# Patient Record
Sex: Male | Born: 1965 | Race: White | Hispanic: No | Marital: Married | Smoking: Former smoker
Health system: Southern US, Community
[De-identification: ages and names within clinical notes are randomized; demographics above are authoritative.]

## PROBLEM LIST (undated history)

## (undated) DIAGNOSIS — I82409 Acute embolism and thrombosis of unspecified deep veins of unspecified lower extremity: Secondary | ICD-10-CM

## (undated) DIAGNOSIS — M1990 Unspecified osteoarthritis, unspecified site: Secondary | ICD-10-CM

## (undated) HISTORY — PX: CYSTECTOMY: SUR359

## (undated) HISTORY — PX: SHOULDER ARTHROSCOPY W/ ACROMIAL REPAIR: SUR94

---

## 2009-03-26 ENCOUNTER — Ambulatory Visit (HOSPITAL_COMMUNITY): Admission: RE | Admit: 2009-03-26 | Discharge: 2009-03-26 | Payer: Self-pay | Admitting: General Surgery

## 2009-12-10 ENCOUNTER — Emergency Department (HOSPITAL_COMMUNITY)
Admission: RE | Admit: 2009-12-10 | Discharge: 2009-12-10 | Payer: Self-pay | Source: Home / Self Care | Admitting: Family Medicine

## 2010-02-14 ENCOUNTER — Ambulatory Visit: Payer: Self-pay | Admitting: Internal Medicine

## 2010-03-01 ENCOUNTER — Ambulatory Visit (HOSPITAL_COMMUNITY)
Admission: RE | Admit: 2010-03-01 | Discharge: 2010-03-01 | Payer: Self-pay | Source: Home / Self Care | Attending: Internal Medicine | Admitting: Internal Medicine

## 2010-03-01 ENCOUNTER — Ambulatory Visit: Payer: Self-pay | Admitting: Internal Medicine

## 2010-04-10 ENCOUNTER — Encounter: Payer: Self-pay | Admitting: Internal Medicine

## 2010-04-18 NOTE — Consult Note (Signed)
NAME:  James Hatfield, IDROVO NO.:  [id number]  MEDICAL RECORD NO.:  [id number]           PATIENT TYPE:  LOCATION:                                FACILITY:  [facility]  PHYSICIAN:  Lionel December, M.D.    DATE OF BIRTH:  [birth year]  DATE OF CONSULTATION:  02/14/2010 DATE OF DISCHARGE:                                CONSULTATION   REASON FOR CONSULTATION:  Recent history of diverticulitis, family history of colon carcinoma.  HISTORY OF PRESENT ILLNESS:  James Hatfield is a 45 year old Caucasian male who is referred through courtesy of Dr. Catalina Pizza for GI evaluation.  About 2 months ago, he developed left lower quadrant abdominal pain and was diagnosed with sigmoid diverticulitis on a visit to emergency room and he had an abdominopelvic CT.  He was treated with antibiotics for 10-14 days with the resolution of his symptoms.  He followed up with Dr. Margo Aye, felt that he should undergo colonoscopy to make sure he does not have lesions other than diverticulitis.  He is presently free of abdominal pain.  He says his bowels move 2-3 times a day and most of his stools are soft formed.  This has been in its regular pattern.  He denies melena or rectal bleeding.  States he has a very good appetite.  He experiences heartburn only with certain foods.  He has not lost any weight recently.  CURRENT MEDICATIONS: 1. Flonase 1 squirt to each nostril daily p.r.n. 2. MVI daily p.r.n.  PAST MEDICAL HISTORY:  He had lipoma removed from his back by Dr. Leticia Penna 1 year ago.  History of diverticulitis as above.  Allergic rhinitis.  ALLERGIES:  To PENICILLIN, details unknown.  FAMILY HISTORY:  Mother is 73 and has heart problems.  Father was diagnosed with colon carcinoma at age 28 and died at 63.  He has 6 sisters, but their health status is unknown.  One brother has been treated for diverticulitis and doing fine.  Other brother's health status is not known.  SOCIAL HISTORY:  He is married  (second marriage).  He does not have any children.  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  He smokes cigarettes off and on no more than 20 cigarettes per month.  He drinks alcohol intermittently and may drink 6- 12 packs over weekend.  OBJECTIVE:  VITAL SIGNS:  Weight 229.7 pounds.  He is 74 inches tall. Pulse 78 per minute, blood pressure 118/80, temperature of 97.4.  HEENT: Conjunctivae are pink.  Sclerae are nonicteric.  Oropharyngeal mucosa is normal. NECK:  No neck masses or thyromegaly noted. CARDIAC:  With regular rhythm.  Normal S1 and S2.  No murmur or gallop noted. LUNGS:  Clear to auscultation. ABDOMEN:  Full.  Bowel sounds are normal on palpation.  Soft abdomen without tenderness, organomegaly or masses. RECTAL:  Deferred.  No peripheral edema or clubbing noted.  ASSESSMENT:  [name] is a 45 year old Caucasian male who was recently treated for sigmoid diverticulitis and he is presently pain free.  I agree his colon needs to be examined to make  sure he did not have any other condition mimicking diverticulitis i.e. tumor.  Family history is significant for colon carcinoma when his father was 52 years old when this diagnosis was made and that does not necessarily put Nthony in a high risk category.  RECOMMENDATIONS:  High-fiber diet.  Diagnostic colonoscopy to be performed in near future.  Also asked Kaylee not to take NSAIDs on regular basis as NSAID therapy increases the risk of diverticulitis.  We appreciate the opportunity to participate in the care of this gentleman.     Lionel December, M.D.     NR/MEDQ  D:  02/14/2010  T:  02/15/2010  Job:  [id number]  cc:   Catalina Pizza, M.D. Fax: [phone]  Electronically Signed by Lionel December M.D. on 04/18/2010 07:11:43 AM

## 2010-06-02 LAB — COMPREHENSIVE METABOLIC PANEL
AST: 20 U/L (ref 0–37)
Albumin: 4.1 g/dL (ref 3.5–5.2)
Calcium: 8.3 mg/dL — ABNORMAL LOW (ref 8.4–10.5)
Creatinine, Ser: 1.36 mg/dL (ref 0.4–1.5)
GFR calc Af Amer: >60 mL/min (ref >60)
GFR calc non Af Amer: 57 mL/min — ABNORMAL LOW (ref >60)

## 2010-06-02 LAB — CBC
MCH: 32 pg (ref 26.0–34.0)
MCHC: 34.8 g/dL (ref 30.0–36.0)
Platelets: 192 10*3/uL (ref 150–400)
RDW: 13.1 % (ref 11.5–15.5)

## 2010-06-02 LAB — DIFFERENTIAL
Eosinophils Relative: 1 % (ref 0–5)
Lymphocytes Relative: 11 % — ABNORMAL LOW (ref 12–46)
Lymphs Abs: 1.2 10*3/uL (ref 0.7–4.0)
Monocytes Absolute: 1 10*3/uL (ref 0.1–1.0)

## 2010-06-02 LAB — LIPASE, BLOOD: Lipase: 25 U/L (ref 11–59)

## 2010-06-05 LAB — BASIC METABOLIC PANEL
BUN: 11 mg/dL (ref 6–23)
Chloride: 101 mEq/L (ref 96–112)
Potassium: 4.2 mEq/L (ref 3.5–5.1)

## 2010-06-05 LAB — CBC
HCT: 44.2 % (ref 39.0–52.0)
Hemoglobin: 14.9 g/dL (ref 13.0–17.0)
MCV: 92.4 fL (ref 78.0–100.0)
Platelets: 209 10*3/uL (ref 150–400)
WBC: 5.3 10*3/uL (ref 4.0–10.5)

## 2014-07-28 ENCOUNTER — Other Ambulatory Visit (HOSPITAL_COMMUNITY): Payer: Self-pay | Admitting: Internal Medicine

## 2014-07-28 ENCOUNTER — Ambulatory Visit (HOSPITAL_COMMUNITY)
Admission: RE | Admit: 2014-07-28 | Discharge: 2014-07-28 | Disposition: A | Discharge status: Home / Self Care | Payer: 59 | Source: Ambulatory Visit | Attending: Internal Medicine | Admitting: Internal Medicine

## 2014-07-28 DIAGNOSIS — R2242 Localized swelling, mass and lump, left lower limb: Secondary | ICD-10-CM | POA: Diagnosis not present

## 2015-03-19 ENCOUNTER — Emergency Department (HOSPITAL_COMMUNITY)
Admission: EM | Admit: 2015-03-19 | Discharge: 2015-03-19 | Disposition: A | Discharge status: Home / Self Care | Payer: 59 | Attending: Emergency Medicine | Admitting: Emergency Medicine

## 2015-03-19 ENCOUNTER — Encounter (HOSPITAL_COMMUNITY): Payer: Self-pay | Admitting: *Deleted

## 2015-03-19 ENCOUNTER — Emergency Department (HOSPITAL_COMMUNITY): Payer: 59

## 2015-03-19 DIAGNOSIS — Y9289 Other specified places as the place of occurrence of the external cause: Secondary | ICD-10-CM | POA: Insufficient documentation

## 2015-03-19 DIAGNOSIS — S92502A Displaced unspecified fracture of left lesser toe(s), initial encounter for closed fracture: Secondary | ICD-10-CM

## 2015-03-19 DIAGNOSIS — Z880 Allergy status to penicillin: Secondary | ICD-10-CM | POA: Diagnosis not present

## 2015-03-19 DIAGNOSIS — Y9389 Activity, other specified: Secondary | ICD-10-CM | POA: Insufficient documentation

## 2015-03-19 DIAGNOSIS — W228XXA Striking against or struck by other objects, initial encounter: Secondary | ICD-10-CM | POA: Insufficient documentation

## 2015-03-19 DIAGNOSIS — Y998 Other external cause status: Secondary | ICD-10-CM | POA: Diagnosis not present

## 2015-03-19 DIAGNOSIS — S90935A Unspecified superficial injury of left lesser toe(s), initial encounter: Secondary | ICD-10-CM | POA: Diagnosis present

## 2015-03-19 DIAGNOSIS — S92512A Displaced fracture of proximal phalanx of left lesser toe(s), initial encounter for closed fracture: Secondary | ICD-10-CM | POA: Diagnosis not present

## 2015-03-19 MED ORDER — DIAZEPAM 5 MG PO TABS
5.0000 mg | ORAL_TABLET | Freq: Once | ORAL | Status: AC
  Administered 2015-03-19: 5 mg via ORAL
  Filled 2015-03-19: qty 1

## 2015-03-19 MED ORDER — HYDROCODONE-ACETAMINOPHEN 5-325 MG PO TABS: 1.0000 | ORAL_TABLET | ORAL | Status: DC | PRN

## 2015-03-19 MED ORDER — HYDROCODONE-ACETAMINOPHEN 5-325 MG PO TABS
2.0000 | ORAL_TABLET | Freq: Once | ORAL | Status: AC
  Administered 2015-03-19: 2 via ORAL
  Filled 2015-03-19: qty 2

## 2015-03-19 MED ORDER — LIDOCAINE HCL (PF) 2 % IJ SOLN
10.0000 mL | Freq: Once | INTRAMUSCULAR | Status: AC
  Administered 2015-03-19: 10 mL
  Filled 2015-03-19: qty 10

## 2015-03-19 NOTE — ED Notes (Signed)
Pt reporting smashing little toe of left foot on coffee table.  Toe swollen at this time.

## 2015-03-19 NOTE — Discharge Instructions (Signed)
Please apply ice to your left fifth toe, and keep your foot elevated above your waist is much as possible. Use Tylenol or ibuprofen for mild discomfort. Use Norco for more severe pain. Toe Fracture A toe fracture is a break in one of the toe bones (phalanges). HOME CARE If You Have a Cast:  Do not stick anything inside the cast to scratch your skin.  Check the skin around the cast every day. Tell your doctor about any concerns. Do not put lotion on the skin underneath the cast. You may put lotion on dry skin around the edges of the cast.  Do not put pressure on any part of the cast until it is fully hardened. This may take many hours.  Keep the cast clean and dry. Bathing  Do not take baths, swim, or use a hot tub until your doctor says that you can. Ask your doctor if you can take showers. You may only be allowed to take sponge baths for bathing.  If your doctor says that bathing and showering are okay, cover the cast or bandage (dressing) with a watertight plastic bag to protect it from water. Do not let the cast or bandage get wet. Managing Pain, Stiffness, and Swelling  If you do not have a cast, put ice on the injured area if told by your doctor:  Put ice in a plastic bag.  Place a towel between your skin and the bag.  Leave the ice on for 20 minutes, 2-3 times per day.  Move your toes often to avoid stiffness and to lessen swelling.  Raise (elevate) the injured area above the level of your heart while you are sitting or lying down. Driving  Do not drive or use heavy machinery while taking pain medicine.  Do not drive while wearing a cast on a foot that you use for driving. Activity  Return to your normal activities as told by your doctor. Ask your doctor what activities are safe for you.  Perform exercises daily as told by your doctor or therapist. Safety  Do not use your leg to support your body weight until your doctor says that you can. Use crutches or other tools  to help you move around as told by your doctor. General Instructions  If your toe was taped to a toe that is next to it (buddy taping), follow your doctor's instructions for changing the gauze and tape. Change it more often:  If the gauze and tape get wet. If this happens, dry the space between the toes.  If the gauze and tape are too tight and they cause your toe to become pale or to lose feeling (numb).  Wear a protective shoe as told by your doctor. If you were not given one, wear sturdy shoes that support your foot. Your shoes should not pinch your toes. Your shoes should not fit tightly against your toes.  Do not use any tobacco products, including cigarettes, chewing tobacco, or e-cigarettes. Tobacco can delay bone healing. If you need help quitting, ask your doctor.  Take medicines only as told by your doctor.  Keep all follow-up visits as told by your doctor. This is important. GET HELP IF:  You have a fever.  Your pain medicine is not helping.  Your toe feels cold.  You lose feeling (have numbness) in your toe.  You still have pain after one week of rest and treatment.  You still have pain after your doctor has said that you can start  walking again.  You have pain or tingling in your foot, and it is not going away.  You have loss of feeling in your foot, and it is not going away. GET HELP RIGHT AWAY IF:  You have severe pain.  You have redness or swelling (inflammation) in your toe, and it is getting worse.  You have pain or loss of feeling in your toe, and it is getting worse.  Your toe is blue.   This information is not intended to replace advice given to you by your health care provider. Make sure you discuss any questions you have with your health care provider.   Document Released: 08/23/2007 Document Revised: 07/21/2014 Document Reviewed: 12/31/2013 Elsevier Interactive Patient Education [employer].

## 2015-03-19 NOTE — ED Provider Notes (Signed)
CSN: [id number]     Arrival date & time 03/19/15  2131 History   None    No chief complaint on file.    (Consider location/radiation/quality/duration/timing/severity/associated sxs/prior Treatment) Patient is a 49 y.o. male presenting with toe pain. The history is provided by the patient.  Toe Pain This is a new problem. The current episode started today. The problem occurs constantly. The problem has been unchanged. Associated symptoms include arthralgias. Pertinent negatives include no abdominal pain, chest pain, coughing, neck pain or numbness. The symptoms are aggravated by standing. He has tried nothing for the symptoms. The treatment provided no relief.    History reviewed. No pertinent past medical history. History reviewed. No pertinent past surgical history. History reviewed. No pertinent family history. Social History  Substance Use Topics  . Smoking status: Never Smoker   . Smokeless tobacco: None  . Alcohol Use: Yes     Comment: occasionally    Review of Systems  Constitutional: Negative for activity change.       All ROS Neg except as noted in HPI  HENT: Negative for nosebleeds.   Eyes: Negative for photophobia and discharge.  Respiratory: Negative for cough, shortness of breath and wheezing.   Cardiovascular: Negative for chest pain and palpitations.  Gastrointestinal: Negative for abdominal pain and blood in stool.  Genitourinary: Negative for dysuria, frequency and hematuria.  Musculoskeletal: Positive for arthralgias. Negative for back pain and neck pain.  Skin: Negative.   Neurological: Negative for dizziness, seizures, speech difficulty and numbness.  Psychiatric/Behavioral: Negative for hallucinations and confusion.      Allergies  Penicillins  Home Medications   Prior to Admission medications   Not on File   BP 152/97 mmHg  Pulse 82  Temp(Src) 97.8 F (36.6 C) (Oral)  Resp 18  Ht 6\' 2"  (1.88 m)  Wt 108.863 kg  BMI 30.80 kg/m2  SpO2  96% Physical Exam  Constitutional: He is oriented to person, place, and time. He appears well-developed and well-nourished.  Non-toxic appearance.  HENT:  Head: Normocephalic.  Right Ear: Tympanic membrane and external ear normal.  Left Ear: Tympanic membrane and external ear normal.  Eyes: EOM and lids are normal. Pupils are equal, round, and reactive to light.  Neck: Normal range of motion. Neck supple. Carotid bruit is not present.  Cardiovascular: Normal rate, regular rhythm, normal heart sounds, intact distal pulses and normal pulses.   Pulmonary/Chest: Breath sounds normal. No respiratory distress.  Abdominal: Soft. Bowel sounds are normal. There is no tenderness. There is no guarding.  Musculoskeletal: Normal range of motion.       Feet:  Lymphadenopathy:       Head (right side): No submandibular adenopathy present.       Head (left side): No submandibular adenopathy present.    He has no cervical adenopathy.  Neurological: He is alert and oriented to person, place, and time. He has normal strength. No cranial nerve deficit or sensory deficit.  Skin: Skin is warm and dry.  Psychiatric: He has a normal mood and affect. His speech is normal.  Nursing note and vitals reviewed.   ED Course  ORTHOPEDIC INJURY TREATMENT Date/Time: 03/19/2015 11:21 PM Performed by: Lily Kocher Authorized by: Lily Kocher Consent: Verbal consent obtained. Risks and benefits: risks, benefits and alternatives were discussed Consent given by: patient Patient understanding: patient states understanding of the procedure being performed Patient identity confirmed: arm band Time out: Immediately prior to procedure a "time out" was called to verify the correct  patient, procedure, equipment, support staff and site/side marked as required. Injury location: toe Location details: left fifth toe Injury type: fracture-dislocation Pre-procedure neurovascular assessment: neurovascularly  intact Pre-procedure distal perfusion: normal Pre-procedure neurological function: normal Pre-procedure range of motion: reduced Local anesthesia used: yes Anesthesia: digital block Local anesthetic: lidocaine 2% without epinephrine Patient sedated: no Manipulation performed: yes Reduction successful: yes Immobilization: splint (buddy tape) Supplies used: cotton padding (post op shoe) Post-procedure neurovascular assessment: post-procedure neurovascularly intact Post-procedure distal perfusion: normal Post-procedure neurological function: normal Patient tolerance: Patient tolerated the procedure well with no immediate complications   (including critical care time) Labs Review Labs Reviewed - No data to display  Imaging Review No results found. I have personally reviewed and evaluated these images and lab results as part of my medical decision-making.   EKG Interpretation None      MDM Patient sustained a fracture/dislocation of the left fifth toe earlier this evening. The x-ray reveals an acute oblique fracture with some displacement and angulation present.  The patient was reduced, buddy tape splint was applied, and postoperative shoe applied. Patient will follow-up with podiatry. Ice and elevation discussed with the patient. Prescription for Norco every 4 hours as needed given to the patient.    Final diagnoses:  None    *I have reviewed nursing notes, vital signs, and all appropriate lab and imaging results for this patient.[address] PA-C 03/19/15 [place], MD 03/20/15 [phone]

## 2016-04-25 ENCOUNTER — Encounter: Payer: Self-pay | Admitting: Internal Medicine

## 2016-04-25 ENCOUNTER — Encounter: Payer: Self-pay | Admitting: *Deleted

## 2018-01-21 ENCOUNTER — Other Ambulatory Visit: Payer: Self-pay | Admitting: Sports Medicine

## 2018-01-21 ENCOUNTER — Other Ambulatory Visit (HOSPITAL_COMMUNITY): Payer: Self-pay | Admitting: Sports Medicine

## 2018-01-21 DIAGNOSIS — M25561 Pain in right knee: Principal | ICD-10-CM

## 2018-01-21 DIAGNOSIS — M25461 Effusion, right knee: Secondary | ICD-10-CM

## 2018-01-21 DIAGNOSIS — G8929 Other chronic pain: Secondary | ICD-10-CM

## 2018-01-25 ENCOUNTER — Ambulatory Visit (HOSPITAL_COMMUNITY)
Admission: RE | Admit: 2018-01-25 | Discharge: 2018-01-25 | Disposition: A | Discharge status: Home / Self Care | Payer: 59 | Source: Ambulatory Visit | Attending: Sports Medicine | Admitting: Sports Medicine

## 2018-01-25 DIAGNOSIS — G8929 Other chronic pain: Secondary | ICD-10-CM | POA: Diagnosis present

## 2018-01-25 DIAGNOSIS — M238X1 Other internal derangements of right knee: Secondary | ICD-10-CM | POA: Insufficient documentation

## 2018-01-25 DIAGNOSIS — M25561 Pain in right knee: Secondary | ICD-10-CM | POA: Diagnosis present

## 2018-01-25 DIAGNOSIS — M7121 Synovial cyst of popliteal space [Baker], right knee: Secondary | ICD-10-CM | POA: Diagnosis not present

## 2018-01-25 DIAGNOSIS — M25461 Effusion, right knee: Secondary | ICD-10-CM | POA: Insufficient documentation

## 2018-02-27 ENCOUNTER — Encounter: Payer: Self-pay | Admitting: *Deleted

## 2018-02-27 ENCOUNTER — Other Ambulatory Visit: Payer: Self-pay

## 2018-02-27 NOTE — Anesthesia Preprocedure Evaluation (Addendum)
Anesthesia Evaluation  Patient identified by MRN, date of birth, ID band Patient awake    Reviewed: Allergy & Precautions, H&P , NPO status , Patient's Chart, lab work & pertinent test results, reviewed documented beta blocker date and time   Airway Mallampati: II  TM Distance: >3 FB Neck ROM: full    Dental no notable dental hx.    Pulmonary former smoker (quit 2005),    Pulmonary exam normal breath sounds clear to auscultation       Cardiovascular Exercise Tolerance: Good + DVT (left leg)  Normal cardiovascular exam Rhythm:regular Rate:Normal     Neuro/Psych negative neurological ROS  negative psych ROS   GI/Hepatic negative GI ROS, Neg liver ROS,   Endo/Other  negative endocrine ROS  Renal/GU negative Renal ROS  negative genitourinary   Musculoskeletal  (+) Arthritis ,   Abdominal   Peds  Hematology negative hematology ROS (+)   Anesthesia Other Findings   Reproductive/Obstetrics negative OB ROS                            Anesthesia Physical Anesthesia Plan  ASA: II  Anesthesia Plan: General   Post-op Pain Management:    Induction: Intravenous  PONV Risk Score and Plan: 2 and Dexamethasone and Ondansetron  Airway Management Planned: LMA  Additional Equipment:   Intra-op Plan:   Post-operative Plan: Extubation in OR  Informed Consent: I have reviewed the patients History and Physical, chart, labs and discussed the procedure including the risks, benefits and alternatives for the proposed anesthesia with the patient or authorized representative who has indicated his/her understanding and acceptance.   Dental Advisory Given  Plan Discussed with: CRNA and Anesthesiologist  Anesthesia Plan Comments:        Anesthesia Quick Evaluation

## 2018-03-06 ENCOUNTER — Ambulatory Visit
Admission: RE | Admit: 2018-03-06 | Discharge: 2018-03-06 | Disposition: A | Discharge status: Home / Self Care | Payer: 59 | Attending: Surgery | Admitting: Surgery

## 2018-03-06 ENCOUNTER — Ambulatory Visit: Payer: 59 | Admitting: Anesthesiology

## 2018-03-06 ENCOUNTER — Encounter
Admission: RE | Disposition: A | Discharge status: Home / Self Care | Payer: Self-pay | Source: Home / Self Care | Attending: Surgery

## 2018-03-06 DIAGNOSIS — Z7982 Long term (current) use of aspirin: Secondary | ICD-10-CM | POA: Insufficient documentation

## 2018-03-06 DIAGNOSIS — M1711 Unilateral primary osteoarthritis, right knee: Secondary | ICD-10-CM | POA: Diagnosis not present

## 2018-03-06 DIAGNOSIS — S83281A Other tear of lateral meniscus, current injury, right knee, initial encounter: Secondary | ICD-10-CM | POA: Diagnosis present

## 2018-03-06 DIAGNOSIS — Z8489 Family history of other specified conditions: Secondary | ICD-10-CM | POA: Diagnosis not present

## 2018-03-06 DIAGNOSIS — S83231A Complex tear of medial meniscus, current injury, right knee, initial encounter: Secondary | ICD-10-CM | POA: Insufficient documentation

## 2018-03-06 DIAGNOSIS — Z880 Allergy status to penicillin: Secondary | ICD-10-CM | POA: Diagnosis not present

## 2018-03-06 DIAGNOSIS — Z86718 Personal history of other venous thrombosis and embolism: Secondary | ICD-10-CM | POA: Insufficient documentation

## 2018-03-06 DIAGNOSIS — Z800 Family history of malignant neoplasm of digestive organs: Secondary | ICD-10-CM | POA: Insufficient documentation

## 2018-03-06 DIAGNOSIS — Z87891 Personal history of nicotine dependence: Secondary | ICD-10-CM | POA: Diagnosis not present

## 2018-03-06 DIAGNOSIS — X58XXXA Exposure to other specified factors, initial encounter: Secondary | ICD-10-CM | POA: Diagnosis not present

## 2018-03-06 DIAGNOSIS — Z79899 Other long term (current) drug therapy: Secondary | ICD-10-CM | POA: Diagnosis not present

## 2018-03-06 HISTORY — DX: Acute embolism and thrombosis of unspecified deep veins of unspecified lower extremity: I82.409

## 2018-03-06 HISTORY — DX: Unspecified osteoarthritis, unspecified site: M19.90

## 2018-03-06 HISTORY — PX: KNEE ARTHROSCOPY WITH MEDIAL MENISECTOMY: SHX5651

## 2018-03-06 SURGERY — ARTHROSCOPY, KNEE, WITH MEDIAL MENISCECTOMY
Anesthesia: General | Site: Knee | Laterality: Right

## 2018-03-06 MED ORDER — LIDOCAINE-EPINEPHRINE 1 %-1:100000 IJ SOLN
INTRAMUSCULAR | Status: DC | PRN
  Administered 2018-03-06: 20 mL

## 2018-03-06 MED ORDER — HYDROMORPHONE HCL 1 MG/ML IJ SOLN: 0.2500 mg | INTRAMUSCULAR | Status: DC | PRN

## 2018-03-06 MED ORDER — PROPOFOL 10 MG/ML IV BOLUS
INTRAVENOUS | Status: DC | PRN
  Administered 2018-03-06: 200 mg via INTRAVENOUS

## 2018-03-06 MED ORDER — LIDOCAINE HCL (CARDIAC) PF 100 MG/5ML IV SOSY
PREFILLED_SYRINGE | INTRAVENOUS | Status: DC | PRN
  Administered 2018-03-06: 30 mg via INTRATRACHEAL

## 2018-03-06 MED ORDER — CLINDAMYCIN PHOSPHATE 600 MG/4ML IJ SOLN
900.0000 mg | Freq: Once | INTRAMUSCULAR | Status: AC
  Administered 2018-03-06: 900 mg via INTRAVENOUS

## 2018-03-06 MED ORDER — ACETAMINOPHEN 160 MG/5ML PO SOLN: 325.0000 mg | ORAL | Status: DC | PRN

## 2018-03-06 MED ORDER — FENTANYL CITRATE (PF) 100 MCG/2ML IJ SOLN
INTRAMUSCULAR | Status: DC | PRN
  Administered 2018-03-06: 25 ug via INTRAVENOUS
  Administered 2018-03-06: 50 ug via INTRAVENOUS
  Administered 2018-03-06: 25 ug via INTRAVENOUS

## 2018-03-06 MED ORDER — OXYCODONE HCL 5 MG PO TABS
5.0000 mg | ORAL_TABLET | Freq: Once | ORAL | Status: AC | PRN
  Administered 2018-03-06: 5 mg via ORAL

## 2018-03-06 MED ORDER — MIDAZOLAM HCL 5 MG/5ML IJ SOLN
INTRAMUSCULAR | Status: DC | PRN
  Administered 2018-03-06: 2 mg via INTRAVENOUS

## 2018-03-06 MED ORDER — POTASSIUM CHLORIDE IN NACL 20-0.9 MEQ/L-% IV SOLN: INTRAVENOUS | Status: DC

## 2018-03-06 MED ORDER — ONDANSETRON HCL 4 MG/2ML IJ SOLN
INTRAMUSCULAR | Status: DC | PRN
  Administered 2018-03-06: 4 mg via INTRAVENOUS

## 2018-03-06 MED ORDER — LACTATED RINGERS IV SOLN
INTRAVENOUS | Status: DC
  Administered 2018-03-06: 11:00:00 via INTRAVENOUS

## 2018-03-06 MED ORDER — DEXAMETHASONE SODIUM PHOSPHATE 4 MG/ML IJ SOLN
INTRAMUSCULAR | Status: DC | PRN
  Administered 2018-03-06: 4 mg via INTRAVENOUS

## 2018-03-06 MED ORDER — LIDOCAINE-EPINEPHRINE 1 %-1:100000 IJ SOLN
INTRAMUSCULAR | Status: DC | PRN
  Administered 2018-03-06: 60000 mL

## 2018-03-06 MED ORDER — ASPIRIN 81 MG PO TABS: 325.0000 mg | ORAL_TABLET | Freq: Every day | ORAL | 0 refills | Status: DC

## 2018-03-06 MED ORDER — HYDROCODONE-ACETAMINOPHEN 5-325 MG PO TABS: 1.0000 | ORAL_TABLET | Freq: Four times a day (QID) | ORAL | 0 refills | Status: DC | PRN

## 2018-03-06 MED ORDER — ACETAMINOPHEN 325 MG PO TABS: 325.0000 mg | ORAL_TABLET | ORAL | Status: DC | PRN

## 2018-03-06 MED ORDER — ONDANSETRON HCL 4 MG/2ML IJ SOLN: 4.0000 mg | Freq: Once | INTRAMUSCULAR | Status: DC | PRN

## 2018-03-06 MED ORDER — OXYCODONE HCL 5 MG/5ML PO SOLN: 5.0000 mg | Freq: Once | ORAL | Status: AC | PRN

## 2018-03-06 SURGICAL SUPPLY — 28 items
BANDAGE ELASTIC 6 LF NS (GAUZE/BANDAGES/DRESSINGS) ×3 IMPLANT
BLADE FULL RADIUS 3.5 (BLADE) ×3 IMPLANT
CHLORAPREP W/TINT 26ML (MISCELLANEOUS) ×3 IMPLANT
COVER LIGHT HANDLE UNIVERSAL (MISCELLANEOUS) ×6 IMPLANT
CUFF TOURN SGL QUICK 30 (MISCELLANEOUS) ×2
CUFF TRNQT CYL LO 30X4X (MISCELLANEOUS) IMPLANT
DRAPE IMP U-DRAPE 54X76 (DRAPES) ×3 IMPLANT
GAUZE PETRO XEROFOAM 1X8 (MISCELLANEOUS) ×2 IMPLANT
GAUZE SPONGE 4X4 12PLY STRL (GAUZE/BANDAGES/DRESSINGS) ×3 IMPLANT
GLOVE BIO SURGEON STRL SZ8 (GLOVE) ×6 IMPLANT
GLOVE INDICATOR 8.0 STRL GRN (GLOVE) ×3 IMPLANT
GOWN STRL REUS W/ TWL LRG LVL3 (GOWN DISPOSABLE) ×1 IMPLANT
GOWN STRL REUS W/ TWL XL LVL3 (GOWN DISPOSABLE) ×1 IMPLANT
GOWN STRL REUS W/TWL LRG LVL3 (GOWN DISPOSABLE) ×2
GOWN STRL REUS W/TWL XL LVL3 (GOWN DISPOSABLE) ×2
IV LACTATED RINGER IRRG 3000ML (IV SOLUTION) ×2
IV LR IRRIG 3000ML ARTHROMATIC (IV SOLUTION) ×2 IMPLANT
KIT TURNOVER KIT A (KITS) ×3 IMPLANT
MANIFOLD 4PT FOR NEPTUNE1 (MISCELLANEOUS) ×3 IMPLANT
NDL HYPO 21X1.5 SAFETY (NEEDLE) ×2 IMPLANT
NEEDLE HYPO 21X1.5 SAFETY (NEEDLE) ×6 IMPLANT
Outside-In Meniscal Repair System ×2 IMPLANT
PACK ARTHROSCOPY KNEE (MISCELLANEOUS) ×3 IMPLANT
STRAP BODY AND KNEE 60X3 (MISCELLANEOUS) ×3 IMPLANT
SUT PDS II 2/0 27IN (SUTURE) ×4 IMPLANT
SUT PROLENE 4 0 PS 2 18 (SUTURE) ×3 IMPLANT
SYR 50ML LL SCALE MARK (SYRINGE) ×3 IMPLANT
TUBING ARTHRO INFLOW-ONLY STRL (TUBING) ×3 IMPLANT

## 2018-03-06 NOTE — H&P (Signed)
Paper H&P to be scanned into permanent record. H&P reviewed and patient re-examined. No changes. 

## 2018-03-06 NOTE — Anesthesia Procedure Notes (Signed)
Procedure Name: LMA Insertion Date/Time: 03/06/2018 1:03 PM Performed by: Cameron Ali, CRNA Pre-anesthesia Checklist: Patient identified, Emergency Drugs available, Suction available, Timeout performed and Patient being monitored Patient Re-evaluated:Patient Re-evaluated prior to induction Oxygen Delivery Method: Circle system utilized Preoxygenation: Pre-oxygenation with 100% oxygen Induction Type: IV induction LMA: LMA inserted LMA Size: 4.0 Number of attempts: 1 Placement Confirmation: positive ETCO2 and breath sounds checked- equal and bilateral Tube secured with: Tape Dental Injury: Teeth and Oropharynx as per pre-operative assessment

## 2018-03-06 NOTE — Transfer of Care (Signed)
Immediate Anesthesia Transfer of Care Note  Patient: James Hatfield  Procedure(s) Performed: KNEE ARTHROSCOPY WITH DEBRIDEMENT AND REPAIR PARTIAL MEDIAL MENISECTOMY (Right Knee)  Patient Location: PACU  Anesthesia Type: General  Level of Consciousness: awake, alert  and patient cooperative  Airway and Oxygen Therapy: Patient Spontanous Breathing and Patient connected to supplemental oxygen  Post-op Assessment: Post-op Vital signs reviewed, Patient's Cardiovascular Status Stable, Respiratory Function Stable, Patent Airway and No signs of Nausea or vomiting  Post-op Vital Signs: Reviewed and stable  Complications: No apparent anesthesia complications

## 2018-03-06 NOTE — Op Note (Signed)
03/06/2018  2:07 PM  Patient:   James Hatfield  Pre-Op Diagnosis:   Complex tear of medial meniscus, right knee.  Postoperative diagnosis:   Complex tear of medial meniscus with central degenerative tear of lateral meniscus, right knee.  Procedure:   Arthroscopically assisted partial repair of medial meniscus tear with partial medial and lateral meniscectomies, right knee.  Surgeon:   Pascal Lux, M.D.  Anesthesia:   General LMA.  Findings:   As above.  There was a complex tear involving the posterior and middle portions of the medial meniscus with an unstable bucket-handle extension into the anteromedial portion of the meniscus.  Laterally, there is a focal area of degenerative tearing involving the central most portion of the central third of the lateral meniscus.  There were focal grade 1-2 chondromalacial changes involving the central ridge of the patella.  Otherwise, the articular surfaces all were in satisfactory condition.  The anterior posterior cruciate ligaments both were in excellent condition.  Complications:   None.  EBL:   5 cc.  Total fluids:   700 cc of crystalloid.  Tourniquet time:   None  Drains:   None  Closure:   4-0 Prolene interrupted sutures.  Brief clinical note:   The patient is a 52 year old male with a 80-month history of medial sided right knee pain. His symptoms have persisted spite medications, activity modification, injections, etc. His history and examination consistent with a medial meniscus tear which was confirmed by MRI scan. The patient presents at this time for arthroscopy, debridement, and repair versus partial medial meniscectomy.  Procedure:   The patient was brought into the operating room and lain in the supine position. After adequate general laryngeal mask anesthesia was obtained, a timeout was performed to verify the appropriate side. The patient's right knee was injected sterilely using a solution of 30 cc of 1% lidocaine with  epinephrine and 30 cc of 0.5% Sensorcaine. The right lower extremity was prepped with ChloraPrep solution before being draped sterilely. Preoperative antibiotics were administered. The expected portal sites were injected with 0.5% Sensorcaine with epinephrine before the camera was placed in the anterolateral portal and instrumentation performed through the anteromedial portal. The knee was sequentially examined beginning in the suprapatellar pouch, then progressing to the patellofemoral space, the medial gutter compartment, the notch, and finally the lateral compartment and gutter. The findings were as described above. Abundant reactive synovial tissues anteriorly were debrided using the full-radius resector in order to improve visualization.   The tear pattern of the medial meniscus was quite complex. The posterior-most portion was quite macerated and so was debrided back to stable margins using the full-radius resector. More medially/anteriorly, there was an unstable bucket-handle type tear. The more posterior portion of the unstable flap was quite bulbous and avascular, so this was debrided. The more anterior portion of the tear was repaired using several outside-in 2-0 PDS sutures placed through a small vertical incision over the medial joint line. Each of the sutures were tied over the capsule. Subsequent probing demonstrated excellent stability of the repair.   Laterally, the area of focal tearing involving the central portion of the central third of the medial lateral meniscus was debrided back to stable margins using a full-radius resector.  Subsequent probing of the remaining rim demonstrated excellent stability.   The instruments were removed from the joint after suctioning the excess fluid. The portal sites were closed using 4-0 Prolene interrupted sutures before a sterile bulky dressing was applied to the knee.  The patient was then awakened, extubated, and returned to the recovery room in  satisfactory condition after tolerating the procedure well.

## 2018-03-06 NOTE — Discharge Instructions (Signed)
General Anesthesia, Adult, Care After These instructions provide you with information about caring for yourself after your procedure. Your health care provider may also give you more specific instructions. Your treatment has been planned according to current medical practices, but problems sometimes occur. Call your health care provider if you have any problems or questions after your procedure. What can I expect after the procedure? After the procedure, it is common to have:  Vomiting.  A sore throat.  Mental slowness.  It is common to feel:  Nauseous.  Cold or shivery.  Sleepy.  Tired.  Sore or achy, even in parts of your body where you did not have surgery.  Follow these instructions at home: For at least 24 hours after the procedure:  Do not: ? Participate in activities where you could fall or become injured. ? Drive. ? Use heavy machinery. ? Drink alcohol. ? Take sleeping pills or medicines that cause drowsiness. ? Make important decisions or sign legal documents. ? Take care of children on your own.  Rest. Eating and drinking  If you vomit, drink water, juice, or soup when you can drink without vomiting.  Drink enough fluid to keep your urine clear or pale yellow.  Make sure you have little or no nausea before eating solid foods.  Follow the diet recommended by your health care provider. General instructions  Have a responsible adult stay with you until you are awake and alert.  Return to your normal activities as told by your health care provider. Ask your health care provider what activities are safe for you.  Take over-the-counter and prescription medicines only as told by your health care provider.  If you smoke, do not smoke without supervision.  Keep all follow-up visits as told by your health care provider. This is important. Contact a health care provider if:  You continue to have nausea or vomiting at home, and medicines are not helpful.  You  cannot drink fluids or start eating again.  You cannot urinate after 8-12 hours.  You develop a skin rash.  You have fever.  You have increasing redness at the site of your procedure. Get help right away if:  You have difficulty breathing.  You have chest pain.  You have unexpected bleeding.  You feel that you are having a life-threatening or urgent problem. This information is not intended to replace advice given to you by your health care provider. Make sure you discuss any questions you have with your health care provider. Document Released: 06/12/2000 Document Revised: 08/09/2015 Document Reviewed: 02/18/2015 Elsevier Interactive Patient Education  2018 [employer] discharge instructions: Keep dressing dry and intact.  May shower after dressing changed on post-op day #4 (Sunday).  Cover staples/sutures with Band-Aids after drying off. Apply ice frequently to knee. Take ibuprofen 600-800 mg TID with meals for 7-10 days, then as necessary. Take pain medication as prescribed or ES Tylenol when needed.  May weight-bear as tolerated - use crutches or walker as needed. Follow-up in 10-14 days or as scheduled.

## 2018-03-06 NOTE — Anesthesia Postprocedure Evaluation (Signed)
Anesthesia Post Note  Patient: James Hatfield  Procedure(s) Performed: KNEE ARTHROSCOPY WITH DEBRIDEMENT AND REPAIR PARTIAL MEDIAL MENISECTOMY (Right Knee)  Patient location during evaluation: PACU Anesthesia Type: General Level of consciousness: awake and alert Pain management: pain level controlled Vital Signs Assessment: post-procedure vital signs reviewed and stable Respiratory status: spontaneous breathing, nonlabored ventilation, respiratory function stable and patient connected to nasal cannula oxygen Cardiovascular status: blood pressure returned to baseline and stable Postop Assessment: no apparent nausea or vomiting Anesthetic complications: no    Trecia Rogers

## 2018-03-07 ENCOUNTER — Encounter: Payer: Self-pay | Admitting: Surgery

## 2019-01-15 ENCOUNTER — Encounter (INDEPENDENT_AMBULATORY_CARE_PROVIDER_SITE_OTHER): Payer: Self-pay | Admitting: *Deleted

## 2019-01-31 ENCOUNTER — Other Ambulatory Visit (INDEPENDENT_AMBULATORY_CARE_PROVIDER_SITE_OTHER): Payer: Self-pay | Admitting: *Deleted

## 2019-01-31 DIAGNOSIS — Z800 Family history of malignant neoplasm of digestive organs: Secondary | ICD-10-CM

## 2019-01-31 DIAGNOSIS — Z1211 Encounter for screening for malignant neoplasm of colon: Secondary | ICD-10-CM | POA: Insufficient documentation

## 2019-04-16 ENCOUNTER — Encounter (INDEPENDENT_AMBULATORY_CARE_PROVIDER_SITE_OTHER): Payer: Self-pay | Admitting: *Deleted

## 2019-04-16 ENCOUNTER — Telehealth (INDEPENDENT_AMBULATORY_CARE_PROVIDER_SITE_OTHER): Payer: Self-pay | Admitting: *Deleted

## 2019-04-16 ENCOUNTER — Other Ambulatory Visit (INDEPENDENT_AMBULATORY_CARE_PROVIDER_SITE_OTHER): Payer: Self-pay | Admitting: *Deleted

## 2019-04-16 DIAGNOSIS — Z1211 Encounter for screening for malignant neoplasm of colon: Secondary | ICD-10-CM

## 2019-04-16 DIAGNOSIS — Z800 Family history of malignant neoplasm of digestive organs: Secondary | ICD-10-CM

## 2019-04-16 NOTE — Telephone Encounter (Signed)
Referring MD/PCP: hall   Procedure: tcs  Reason/Indication:  Screening, fam hx colon ca  Has patient had this procedure before?  Yes, 2011  If so, when, by whom and where?    Is there a family history of colon cancer?  Yes, father  Who?  What age when diagnosed?    Is patient diabetic?   no      Does patient have prosthetic heart valve or mechanical valve?  no  Do you have a pacemaker/defibrillator?  no  Has patient ever had endocarditis/atrial fibrillation? no  Does patient use oxygen? no  Has patient had joint replacement within last 12 months?  no  Is patient constipated or do they take laxatives? no  Does patient have a history of alcohol/drug use?  no  Is patient on blood thinner such as Coumadin, Plavix and/or Aspirin? no  Medications: atorvastatin 40 mg daily, fluticasone daily, xyzal daily, ibuprofen daily, asa 81 mg daily  Allergies: pcn  Medication Adjustment per Dr Laural Golden: asa 2 days before procedure  Procedure date & time: 05/01/19

## 2019-04-16 NOTE — Telephone Encounter (Signed)
Patient needs suprep TCS sch'd 05/01/19

## 2019-04-18 ENCOUNTER — Other Ambulatory Visit (INDEPENDENT_AMBULATORY_CARE_PROVIDER_SITE_OTHER): Payer: Self-pay | Admitting: *Deleted

## 2019-04-18 MED ORDER — SUPREP BOWEL PREP KIT 17.5-3.13-1.6 GM/177ML PO SOLN: 1.0000 | Freq: Once | ORAL | 0 refills | Status: AC

## 2019-04-20 NOTE — Telephone Encounter (Signed)
Colonoscopy with conscious sedation 

## 2019-04-29 ENCOUNTER — Other Ambulatory Visit: Payer: Self-pay

## 2019-04-29 ENCOUNTER — Other Ambulatory Visit (HOSPITAL_COMMUNITY)
Admission: RE | Admit: 2019-04-29 | Discharge: 2019-04-29 | Disposition: A | Discharge status: Home / Self Care | Payer: 59 | Source: Ambulatory Visit | Attending: Internal Medicine | Admitting: Internal Medicine

## 2019-04-29 DIAGNOSIS — Z01812 Encounter for preprocedural laboratory examination: Secondary | ICD-10-CM | POA: Diagnosis present

## 2019-04-29 DIAGNOSIS — Z20822 Contact with and (suspected) exposure to covid-19: Secondary | ICD-10-CM | POA: Diagnosis not present

## 2019-04-29 LAB — SARS CORONAVIRUS 2 (TAT 6-24 HRS): SARS Coronavirus 2: NEGATIVE

## 2019-05-01 ENCOUNTER — Encounter (HOSPITAL_COMMUNITY)
Admission: RE | Disposition: A | Discharge status: Home / Self Care | Payer: Self-pay | Source: Home / Self Care | Attending: Internal Medicine

## 2019-05-01 ENCOUNTER — Other Ambulatory Visit: Payer: Self-pay

## 2019-05-01 ENCOUNTER — Ambulatory Visit (HOSPITAL_COMMUNITY): Admit: 2019-05-01 | Payer: 59 | Admitting: Internal Medicine

## 2019-05-01 ENCOUNTER — Encounter (HOSPITAL_COMMUNITY): Payer: Self-pay

## 2019-05-01 ENCOUNTER — Encounter (HOSPITAL_COMMUNITY): Payer: Self-pay | Admitting: Internal Medicine

## 2019-05-01 ENCOUNTER — Ambulatory Visit (HOSPITAL_COMMUNITY)
Admission: RE | Admit: 2019-05-01 | Discharge: 2019-05-01 | Disposition: A | Discharge status: Home / Self Care | Payer: 59 | Attending: Internal Medicine | Admitting: Internal Medicine

## 2019-05-01 DIAGNOSIS — K5730 Diverticulosis of large intestine without perforation or abscess without bleeding: Secondary | ICD-10-CM

## 2019-05-01 DIAGNOSIS — Z8719 Personal history of other diseases of the digestive system: Secondary | ICD-10-CM | POA: Insufficient documentation

## 2019-05-01 DIAGNOSIS — Z1211 Encounter for screening for malignant neoplasm of colon: Secondary | ICD-10-CM

## 2019-05-01 DIAGNOSIS — Z800 Family history of malignant neoplasm of digestive organs: Secondary | ICD-10-CM

## 2019-05-01 DIAGNOSIS — Z87891 Personal history of nicotine dependence: Secondary | ICD-10-CM | POA: Insufficient documentation

## 2019-05-01 DIAGNOSIS — K621 Rectal polyp: Secondary | ICD-10-CM

## 2019-05-01 DIAGNOSIS — Z79899 Other long term (current) drug therapy: Secondary | ICD-10-CM | POA: Diagnosis not present

## 2019-05-01 DIAGNOSIS — Z7982 Long term (current) use of aspirin: Secondary | ICD-10-CM | POA: Insufficient documentation

## 2019-05-01 DIAGNOSIS — Z86718 Personal history of other venous thrombosis and embolism: Secondary | ICD-10-CM | POA: Insufficient documentation

## 2019-05-01 DIAGNOSIS — K644 Residual hemorrhoidal skin tags: Secondary | ICD-10-CM | POA: Insufficient documentation

## 2019-05-01 HISTORY — PX: COLONOSCOPY: SHX5424

## 2019-05-01 HISTORY — PX: POLYPECTOMY: SHX5525

## 2019-05-01 SURGERY — COLONOSCOPY
Anesthesia: Moderate Sedation

## 2019-05-01 MED ORDER — MEPERIDINE HCL 50 MG/ML IJ SOLN
INTRAMUSCULAR | Status: DC | PRN
  Administered 2019-05-01 (×2): 25 mg via INTRAVENOUS

## 2019-05-01 MED ORDER — STERILE WATER FOR IRRIGATION IR SOLN
Status: DC | PRN
  Administered 2019-05-01: 1.5 mL

## 2019-05-01 MED ORDER — MEPERIDINE HCL 50 MG/ML IJ SOLN
INTRAMUSCULAR | Status: AC
  Filled 2019-05-01: qty 1

## 2019-05-01 MED ORDER — MIDAZOLAM HCL 5 MG/5ML IJ SOLN
INTRAMUSCULAR | Status: DC | PRN
  Administered 2019-05-01: 3 mg via INTRAVENOUS
  Administered 2019-05-01 (×2): 2 mg via INTRAVENOUS
  Administered 2019-05-01: 1 mg via INTRAVENOUS

## 2019-05-01 MED ORDER — MIDAZOLAM HCL 5 MG/5ML IJ SOLN
INTRAMUSCULAR | Status: AC
  Filled 2019-05-01: qty 10

## 2019-05-01 MED ORDER — SODIUM CHLORIDE 0.9 % IV SOLN: INTRAVENOUS | Status: DC

## 2019-05-01 NOTE — Op Note (Signed)
[facility] Patient Name: James Hatfield Procedure Date: 05/01/2019 8:10 AM MRN: [id number] Date of Birth: December 22, 1965 Attending MD: Hildred Laser , MD CSN: [id number] Age: 54 Admit Type: Outpatient Procedure:                Colonoscopy Indications:              Screening for colorectal malignant neoplasm Providers:                Hildred Laser, MD, Otis Peak B. Sharon Seller, RN, Nelma Rothman, Technician Referring MD:             Delphina Cahill, MD Medicines:                Meperidine 50 mg IV, Midazolam 8 mg IV Complications:            No immediate complications. Estimated Blood Loss:     Estimated blood loss was minimal. Procedure:                Pre-Anesthesia Assessment:                           - Prior to the procedure, a History and Physical                            was performed, and patient medications and                            allergies were reviewed. The patient's tolerance of                            previous anesthesia was also reviewed. The risks                            and benefits of the procedure and the sedation                            options and risks were discussed with the patient.                            All questions were answered, and informed consent                            was obtained. Prior Anticoagulants: The patient has                            taken no previous anticoagulant or antiplatelet                            agents except for aspirin and has taken no previous                            anticoagulant or antiplatelet agents except for  NSAID medication. ASA Grade Assessment: II - A                            patient with mild systemic disease. After reviewing                            the risks and benefits, the patient was deemed in                            satisfactory condition to undergo the procedure.                           After obtaining informed consent, the  colonoscope                            was passed under direct vision. Throughout the                            procedure, the patient's blood pressure, pulse, and                            oxygen saturations were monitored continuously. The                            [id number]-[id number]) scope was introduced through                            the anus and advanced to the the cecum, identified                            by appendiceal orifice and ileocecal valve. The                            colonoscopy was performed without difficulty. The                            patient tolerated the procedure well. The quality                            of the bowel preparation was good. The ileocecal                            valve, appendiceal orifice, and rectum were                            photographed. Scope In: 8:29:50 AM Scope Out: 8:48:57 AM Scope Withdrawal Time: 0 hours 13 minutes 12 seconds  Total Procedure Duration: 0 hours 19 minutes 7 seconds  Findings:      The perianal and digital rectal examinations were normal.      Scattered diverticula were found in the sigmoid colon and descending       colon.      Two sessile polyps were found in the rectum. The polyps were small in       size.  These polyps were removed with a cold snare. Resection and       retrieval were complete. The pathology specimen was placed into Bottle       Number 1.      External hemorrhoids were found during retroflexion. The hemorrhoids       were small. Impression:               - Diverticulosis in the sigmoid colon and in the                            descending colon.                           - Two small polyps in the rectum, removed with a                            cold snare. Resected and retrieved.                           - External hemorrhoids. Moderate Sedation:      Moderate (conscious) sedation was administered by the endoscopy nurse       and supervised by the endoscopist. The following  parameters were       monitored: oxygen saturation, heart rate, blood pressure, CO2       capnography and response to care. Total physician intraservice time was       25 minutes. Recommendation:           - Patient has a contact number available for                            emergencies. The signs and symptoms of potential                            delayed complications were discussed with the                            patient. Return to normal activities tomorrow.                            Written discharge instructions were provided to the                            patient.                           - High fiber diet today.                           - Continue present medications.                           - No aspirin, ibuprofen, naproxen, or other                            non-steroidal anti-inflammatory drugs for 1 day.                           -  Await pathology results.                           - Repeat colonoscopy is recommended. The                            colonoscopy date will be determined after pathology                            results from today's exam become available for                            review. Procedure Code(s):        --- Professional ---                           [phone], Colonoscopy, flexible; with removal of                            tumor(s), polyp(s), or other lesion(s) by snare                            technique                           99153, Moderate sedation; each additional 15                            minutes intraservice time                           G0500, Moderate sedation services provided by the                            same physician or other qualified health care                            professional performing a gastrointestinal                            endoscopic service that sedation supports,                            requiring the presence of an independent trained                            observer to assist  in the monitoring of the                            patient's level of consciousness and physiological                            status; initial 15 minutes of intra-service time;                            patient age 37 years or older (additional  time may                            be reported with [phone], as appropriate) Diagnosis Code(s):        --- Professional ---                           Z12.11, Encounter for screening for malignant                            neoplasm of colon                           K64.4, Residual hemorrhoidal skin tags                           K62.1, Rectal polyp                           K57.30, Diverticulosis of large intestine without                            perforation or abscess without bleeding CPT copyright 2019 [employer]. All rights reserved. The codes documented in this report are preliminary and upon coder review may  be revised to meet current compliance requirements. Hildred Laser, MD Hildred Laser, MD 05/01/2019 8:56:28 AM This report has been signed electronically. Number of Addenda: 0

## 2019-05-01 NOTE — H&P (Signed)
James Hatfield is an 54 y.o. male.   Chief Complaint: Patient is here for colonoscopy. HPI: Patient is 54 year old Caucasian male who is here for screening colonoscopy.  His last exam was in 2011 with removal of small polyp and was hyperplastic.  He denies abdominal pain change in bowel habits or rectal bleeding.  He is on low-dose aspirin which is on hold.  He also takes Aleve for osteoarthritis on as-needed basis. Family history significant for colon carcinoma but his father was in his late 27s.  Past Medical History:  Diagnosis Date  . Arthritis    knees, hips  . DVT (deep venous thrombosis) ([place])    left leg     Obesity  Past Surgical History:  Procedure Laterality Date  . CYSTECTOMY     back  . KNEE ARTHROSCOPY WITH MEDIAL MENISECTOMY Right 03/06/2018   Procedure: KNEE ARTHROSCOPY WITH DEBRIDEMENT AND REPAIR PARTIAL MEDIAL MENISECTOMY;  Surgeon: Corky Mull, MD;  Location: [facility];  Service: Orthopedics;  Laterality: Right;  SM[employer]PHEW  . SHOULDER ARTHROSCOPY W/ ACROMIAL REPAIR Left     History reviewed. No pertinent family history. Social History:  reports that he quit smoking about 16 years ago. He has never used smokeless tobacco. He reports current alcohol use of about 24.0 standard drinks of alcohol per week. He reports that he does not use drugs.  Allergies:  Allergies  Allergen Reactions  . Penicillins Anaphylaxis and Swelling    Did it involve swelling of the face/tongue/throat, SOB, or low BP? Yes Did it involve sudden or severe rash/hives, skin peeling, or any reaction on the inside of your mouth or nose? No Did you need to seek medical attention at a hospital or doctor's office? Unknown When did it last happen?childhood allergy If all above answers are "NO", may proceed with cephalosporin use.     Medications Prior to Admission  Medication Sig Dispense Refill  . aspirin EC 325 MG tablet Take 325 mg by mouth daily.    Marland Kitchen atorvastatin  (LIPITOR) 40 MG tablet Take 40 mg by mouth daily.    . calcium carbonate (TUMS - DOSED IN MG ELEMENTAL CALCIUM) 500 MG chewable tablet Chew 1 tablet by mouth daily as needed for indigestion or heartburn.    . fluticasone (FLONASE) 50 MCG/ACT nasal spray Place 1 spray into both nostrils daily.     Marland Kitchen ibuprofen (ADVIL) 200 MG tablet Take 600 mg by mouth every 6 (six) hours as needed for headache or moderate pain.    Marland Kitchen levocetirizine (XYZAL) 5 MG tablet Take 5 mg by mouth daily.       No results found for this or any previous visit (from the past 48 hour(s)). No results found.  Review of Systems  Blood pressure 124/82, pulse 64, temperature 97.8 F (36.6 C), temperature source Oral, resp. rate (!) 21, height 6\' 2"  (1.88 m), weight 77.6 kg, SpO2 97 %. Physical Exam  Constitutional: He appears well-developed and well-nourished.  HENT:  Mouth/Throat: Oropharynx is clear and moist.  Eyes: Conjunctivae are normal. No scleral icterus.  Neck: No thyromegaly present.  Cardiovascular: Normal rate, regular rhythm and normal heart sounds.  No murmur heard. Respiratory: Effort normal and breath sounds normal.  GI:  Abdomen is protuberant.  Soft and nontender with organomegaly or masses.  Musculoskeletal:        General: No edema.  Lymphadenopathy:    He has no cervical adenopathy.  Neurological: He is alert.  Skin: Skin is  warm and dry.     Assessment/Plan Average risk screening colonoscopy. History of colon carcinoma and first-degree relative at late onset  Hildred Laser, MD 05/01/2019, 8:19 AM

## 2019-05-01 NOTE — Discharge Instructions (Signed)
Resume aspirin on 05/02/2019. No Aleve for 24 hours. Resume other medications as before. High-fiber diet. No driving for 24 hours. Physician will call with biopsy results.     Colonoscopy, Adult, Care After This sheet gives you information about how to care for yourself after your procedure. Your doctor may also give you more specific instructions. If you have problems or questions, call your doctor. What can I expect after the procedure? After the procedure, it is common to have:  A small amount of blood in your poop (stool) for 24 hours.  Some gas.  Mild cramping or bloating in your belly (abdomen). Follow these instructions at home: Eating and drinking   Drink enough fluid to keep your pee (urine) pale yellow.  Follow instructions from your doctor about what you cannot eat or drink.  Return to your normal diet as told by your doctor. Avoid heavy or fried foods that are hard to digest. Activity  Rest as told by your doctor.  Do not sit for a long time without moving. Get up to take short walks every 1-2 hours. This is important. Ask for help if you feel weak or unsteady.  Return to your normal activities as told by your doctor. Ask your doctor what activities are safe for you. To help cramping and bloating:   Try walking around.  Put heat on your belly as told by your doctor. Use the heat source that your doctor recommends, such as a moist heat pack or a heating pad. ? Put a towel between your skin and the heat source. ? Leave the heat on for 20-30 minutes. ? Remove the heat if your skin turns bright red. This is very important if you are unable to feel pain, heat, or cold. You may have a greater risk of getting burned. General instructions  For the first 24 hours after the procedure: ? Do not drive or use machinery. ? Do not sign important documents. ? Do not drink alcohol. ? Do your daily activities more slowly than normal. ? Eat foods that are soft and easy to  digest.  Take over-the-counter or prescription medicines only as told by your doctor.  Keep all follow-up visits as told by your doctor. This is important. Contact a doctor if:  You have blood in your poop 2-3 days after the procedure. Get help right away if:  You have more than a small amount of blood in your poop.  You see large clumps of tissue (blood clots) in your poop.  Your belly is swollen.  You feel like you may vomit (nauseous).  You vomit.  You have a fever.  You have belly pain that gets worse, and medicine does not help your pain. Summary  After the procedure, it is common to have a small amount of blood in your poop. You may also have mild cramping and bloating in your belly.  For the first 24 hours after the procedure, do not drive or use machinery, do not sign important documents, and do not drink alcohol.  Get help right away if you have a lot of blood in your poop, feel like you may vomit, have a fever, or have more belly pain. This information is not intended to replace advice given to you by your health care provider. Make sure you discuss any questions you have with your health care provider. Document Revised: 09/30/2018 Document Reviewed: 09/30/2018 Elsevier Patient Education  [place].    Colon Polyps  Polyps are tissue  growths inside the body. Polyps can grow in many places, including the large intestine (colon). A polyp may be a round bump or a mushroom-shaped growth. You could have one polyp or several. Most colon polyps are noncancerous (benign). However, some colon polyps can become cancerous over time. Finding and removing the polyps early can help prevent this. What are the causes? The exact cause of colon polyps is not known. What increases the risk? You are more likely to develop this condition if you:  Have a family history of colon cancer or colon polyps.  Are older than 79 or older than 45 if you are African American.  Have  inflammatory bowel disease, such as ulcerative colitis or Crohn's disease.  Have certain hereditary conditions, such as: ? Familial adenomatous polyposis. ? Lynch syndrome. ? Turcot syndrome. ? Peutz-Jeghers syndrome.  Are overweight.  Smoke cigarettes.  Do not get enough exercise.  Drink too much alcohol.  Eat a diet that is high in fat and red meat and low in fiber.  Had childhood cancer that was treated with abdominal radiation. What are the signs or symptoms? Most polyps do not cause symptoms. If you have symptoms, they may include:  Blood coming from your rectum when having a bowel movement.  Blood in your stool. The stool may look dark red or black.  Abdominal pain.  A change in bowel habits, such as constipation or diarrhea. How is this diagnosed? This condition is diagnosed with a colonoscopy. This is a procedure in which a lighted, flexible scope is inserted into the anus and then passed into the colon to examine the area. Polyps are sometimes found when a colonoscopy is done as part of routine cancer screening tests. How is this treated? Treatment for this condition involves removing any polyps that are found. Most polyps can be removed during a colonoscopy. Those polyps will then be tested for cancer. Additional treatment may be needed depending on the results of testing. Follow these instructions at home: Lifestyle  Maintain a healthy weight, or lose weight if recommended by your health care provider.  Exercise every day or as told by your health care provider.  Do not use any products that contain nicotine or tobacco, such as cigarettes and e-cigarettes. If you need help quitting, ask your health care provider.  If you drink alcohol, limit how much you have: ? 0-1 drink a day for women. ? 0-2 drinks a day for men.  Be aware of how much alcohol is in your drink. In the [place] one drink equals one 12 oz bottle of beer (355 mL), one 5 oz glass of wine (148 mL),  or one 1 oz shot of hard liquor (44 mL). Eating and drinking   Eat foods that are high in fiber, such as fruits, vegetables, and whole grains.  Eat foods that are high in calcium and vitamin D, such as milk, cheese, yogurt, eggs, liver, fish, and broccoli.  Limit foods that are high in fat, such as fried foods and desserts.  Limit the amount of red meat and processed meat you eat, such as hot dogs, sausage, bacon, and lunch meats. General instructions  Keep all follow-up visits as told by your health care provider. This is important. ? This includes having regularly scheduled colonoscopies. ? Talk to your health care provider about when you need a colonoscopy. Contact a health care provider if:  You have new or worsening bleeding during a bowel movement.  You have new or increased blood  in your stool.  You have a change in bowel habits.  You lose weight for no known reason. Summary  Polyps are tissue growths inside the body. Polyps can grow in many places, including the colon.  Most colon polyps are noncancerous (benign), but some can become cancerous over time.  This condition is diagnosed with a colonoscopy.  Treatment for this condition involves removing any polyps that are found. Most polyps can be removed during a colonoscopy. This information is not intended to replace advice given to you by your health care provider. Make sure you discuss any questions you have with your health care provider. Document Revised: 06/21/2017 Document Reviewed: 06/21/2017 Elsevier Patient Education  [place].    High-Fiber Diet Fiber, also called dietary fiber, is a type of carbohydrate that is found in fruits, vegetables, whole grains, and beans. A high-fiber diet can have many health benefits. Your health care provider may recommend a high-fiber diet to help:  Prevent constipation. Fiber can make your bowel movements more regular.  Lower your cholesterol.  Relieve the  following conditions: ? Swelling of veins in the anus (hemorrhoids). ? Swelling and irritation (inflammation) of specific areas of the digestive tract (uncomplicated diverticulosis). ? A problem of the large intestine (colon) that sometimes causes pain and diarrhea (irritable bowel syndrome, IBS).  Prevent overeating as part of a weight-loss plan.  Prevent heart disease, type 2 diabetes, and certain cancers. What is my plan? The recommended daily fiber intake in grams (g) includes:  38 g for men age 43 or younger.  30 g for men over age 10.  61 g for women age 74 or younger.  21 g for women over age 39. You can get the recommended daily intake of dietary fiber by:  Eating a variety of fruits, vegetables, grains, and beans.  Taking a fiber supplement, if it is not possible to get enough fiber through your diet. What do I need to know about a high-fiber diet?  It is better to get fiber through food sources rather than from fiber supplements. There is not a lot of research about how effective supplements are.  Always check the fiber content on the nutrition facts label of any prepackaged food. Look for foods that contain 5 g of fiber or more per serving.  Talk with a diet and nutrition specialist (dietitian) if you have questions about specific foods that are recommended or not recommended for your medical condition, especially if those foods are not listed below.  Gradually increase how much fiber you consume. If you increase your intake of dietary fiber too quickly, you may have bloating, cramping, or gas.  Drink plenty of water. Water helps you to digest fiber. What are tips for following this plan?  Eat a wide variety of high-fiber foods.  Make sure that half of the grains that you eat each day are whole grains.  Eat breads and cereals that are made with whole-grain flour instead of refined flour or white flour.  Eat brown rice, bulgur wheat, or millet instead of white  rice.  Start the day with a breakfast that is high in fiber, such as a cereal that contains 5 g of fiber or more per serving.  Use beans in place of meat in soups, salads, and pasta dishes.  Eat high-fiber snacks, such as berries, raw vegetables, nuts, and popcorn.  Choose whole fruits and vegetables instead of processed forms like juice or sauce. What foods can I eat?  Fruits Berries.  Pears. Apples. Oranges. Avocado. Prunes and raisins. Dried figs. Vegetables Sweet potatoes. Spinach. Kale. Artichokes. Cabbage. Broccoli. Cauliflower. Green peas. Carrots. Squash. Grains Whole-grain breads. Multigrain cereal. Oats and oatmeal. Brown rice. Barley. Bulgur wheat. [place]. Quinoa. Bran muffins. Popcorn. Rye wafer crackers. Meats and other proteins Navy, kidney, and pinto beans. Soybeans. Split peas. Lentils. Nuts and seeds. Dairy Fiber-fortified yogurt. Beverages Fiber-fortified soy milk. Fiber-fortified orange juice. Other foods Fiber bars. The items listed above may not be a complete list of recommended foods and beverages. Contact a dietitian for more options. What foods are not recommended? Fruits Fruit juice. Cooked, strained fruit. Vegetables Fried potatoes. Canned vegetables. Well-cooked vegetables. Grains White bread. Pasta made with refined flour. White rice. Meats and other proteins Fatty cuts of meat. Fried chicken or fried fish. Dairy Milk. Yogurt. Cream cheese. Sour cream. Fats and oils Butters. Beverages Soft drinks. Other foods Cakes and pastries. The items listed above may not be a complete list of foods and beverages to avoid. Contact a dietitian for more information. Summary  Fiber is a type of carbohydrate. It is found in fruits, vegetables, whole grains, and beans.  There are many health benefits of eating a high-fiber diet, such as preventing constipation, lowering blood cholesterol, helping with weight loss, and reducing your risk of heart disease,  diabetes, and certain cancers.  Gradually increase your intake of fiber. Increasing too fast can result in cramping, bloating, and gas. Drink plenty of water while you increase your fiber.  The best sources of fiber include whole fruits and vegetables, whole grains, nuts, seeds, and beans. This information is not intended to replace advice given to you by your health care provider. Make sure you discuss any questions you have with your health care provider. Document Revised: 01/08/2017 Document Reviewed: 01/08/2017 Elsevier Patient Education  2020 [employer].

## 2019-05-02 LAB — SURGICAL PATHOLOGY

## 2019-06-30 ENCOUNTER — Emergency Department (HOSPITAL_COMMUNITY): Payer: 59

## 2019-06-30 ENCOUNTER — Emergency Department (HOSPITAL_COMMUNITY)
Admission: EM | Admit: 2019-06-30 | Discharge: 2019-07-01 | Disposition: A | Discharge status: Home / Self Care | Payer: 59 | Attending: Emergency Medicine | Admitting: Emergency Medicine

## 2019-06-30 ENCOUNTER — Other Ambulatory Visit: Payer: Self-pay

## 2019-06-30 DIAGNOSIS — Z87891 Personal history of nicotine dependence: Secondary | ICD-10-CM | POA: Insufficient documentation

## 2019-06-30 DIAGNOSIS — Y9301 Activity, walking, marching and hiking: Secondary | ICD-10-CM | POA: Insufficient documentation

## 2019-06-30 DIAGNOSIS — Z7982 Long term (current) use of aspirin: Secondary | ICD-10-CM | POA: Insufficient documentation

## 2019-06-30 DIAGNOSIS — S99911A Unspecified injury of right ankle, initial encounter: Secondary | ICD-10-CM | POA: Diagnosis present

## 2019-06-30 DIAGNOSIS — Y999 Unspecified external cause status: Secondary | ICD-10-CM | POA: Diagnosis not present

## 2019-06-30 DIAGNOSIS — Z79899 Other long term (current) drug therapy: Secondary | ICD-10-CM | POA: Diagnosis not present

## 2019-06-30 DIAGNOSIS — X500XXA Overexertion from strenuous movement or load, initial encounter: Secondary | ICD-10-CM | POA: Insufficient documentation

## 2019-06-30 DIAGNOSIS — S82831A Other fracture of upper and lower end of right fibula, initial encounter for closed fracture: Secondary | ICD-10-CM | POA: Insufficient documentation

## 2019-06-30 DIAGNOSIS — T1490XA Injury, unspecified, initial encounter: Secondary | ICD-10-CM

## 2019-06-30 DIAGNOSIS — Y929 Unspecified place or not applicable: Secondary | ICD-10-CM | POA: Insufficient documentation

## 2019-06-30 NOTE — ED Triage Notes (Signed)
Pt c/o right foot pain. Pt states he rolled his ankle last Monday.

## 2019-06-30 NOTE — ED Notes (Signed)
Pt to xr 

## 2019-07-01 MED ORDER — HYDROCODONE-ACETAMINOPHEN 5-325 MG PO TABS: ORAL_TABLET | ORAL | 0 refills | Status: DC

## 2019-07-01 NOTE — Discharge Instructions (Addendum)
Your x-ray this evening shows that you have a broken bone of your right ankle.  This will likely heal on its own in 4 to 6 weeks, but it is important that you follow-up with your orthopedic provider in 1 week for recheck to ensure that it is healing properly.  You may elevate your foot when possible.  Continue to take ibuprofen 600 to 800 mg 3 times a day with food.

## 2019-07-01 NOTE — ED Provider Notes (Signed)
[facility] EMERGENCY DEPARTMENT Provider Note   CSN: [id number] Arrival date & time: 06/30/19  2215     History Chief Complaint  Patient presents with  . Foot Pain    James Hatfield is a 54 y.o. male.  HPI      James Hatfield is a 54 y.o. male who presents to the Emergency Department complaining of right foot and ankle pain for 1 week.  He states that he stepped off of an uneven surface 1 week ago and "twisted" his right ankle.  He reports having persistent pain and swelling of his ankle and foot since the injury.  He is continue to work and bear weight.  He has been wrapping his foot and ankle with an Ace wrap which provides temporary support.  Pain became worse earlier today.  He denies numbness or tingling of his foot or toes.  No other injuries.   Past Medical History:  Diagnosis Date  . Arthritis    knees, hips  . DVT (deep venous thrombosis) ([place])    left leg    Patient Active Problem List   Diagnosis Date Noted  . Special screening for malignant neoplasms, colon 01/31/2019  . Family hx of colon cancer 01/31/2019    Past Surgical History:  Procedure Laterality Date  . COLONOSCOPY N/A 05/01/2019   Procedure: COLONOSCOPY;  Surgeon: Rogene Houston, MD;  [facility];  Service: Endoscopy;  Laterality: N/A;  830  . CYSTECTOMY     back  . KNEE ARTHROSCOPY WITH MEDIAL MENISECTOMY Right 03/06/2018   Procedure: KNEE ARTHROSCOPY WITH DEBRIDEMENT AND REPAIR PARTIAL MEDIAL MENISECTOMY;  Surgeon: Corky Mull, MD;  Location: [place];  Service: Orthopedics;  Laterality: Right;  SMITH AND NEPHEW  . POLYPECTOMY  05/01/2019   Procedure: POLYPECTOMY;  Surgeon: Rogene Houston, MD;  Location[facility];  Service: Endoscopy;;  . SHOULDER ARTHROSCOPY W/ ACROMIAL REPAIR Left        No family history on file.  Social History   Tobacco Use  . Smoking status: Former Smoker    Quit date: 2005    Years since quitting: 16.2  . Smokeless tobacco:  Never Used  . Tobacco comment: smoked off and on since age 56  Substance Use Topics  . Alcohol use: Yes    Alcohol/week: 24.0 standard drinks    Types: 24 Cans of beer per week    Comment:    . Drug use: No    Home Medications Prior to Admission medications   Medication Sig Start Date End Date Taking? Authorizing Provider  aspirin EC 325 MG tablet Take 1 tablet (325 mg total) by mouth daily. 05/02/19   Rehman, Mechele Dawley, MD  atorvastatin (LIPITOR) 40 MG tablet Take 40 mg by mouth daily.    [provider]  calcium carbonate (TUMS - DOSED IN MG ELEMENTAL CALCIUM) 500 MG chewable tablet Chew 1 tablet by mouth daily as needed for indigestion or heartburn.    [provider]  fluticasone (FLONASE) 50 MCG/ACT nasal spray Place 1 spray into both nostrils daily.     [provider]  ibuprofen (ADVIL) 200 MG tablet Take 600 mg by mouth every 6 (six) hours as needed for headache or moderate pain.    [provider]  levocetirizine (XYZAL) 5 MG tablet Take 5 mg by mouth daily.     [provider]    Allergies    Penicillins  Review of Systems   Review  of Systems  Constitutional: Negative for chills and fever.  Respiratory: Negative for chest tightness and shortness of breath.   Cardiovascular: Negative for chest pain.  Gastrointestinal: Negative for abdominal pain.  Genitourinary: Negative for difficulty urinating and dysuria.  Musculoskeletal: Positive for arthralgias (Right foot and ankle pain and swelling).  Skin: Negative for color change and wound.  Neurological: Negative for weakness and numbness.    Physical Exam Updated Vital Signs BP 122/76 (BP Location: Left Arm)   Pulse 78   Temp 98.1 F (36.7 C) (Oral)   Resp (!) 21   Ht 6\' 2"  (1.88 m)   Wt 77.1 kg   SpO2 98%   BMI 21.83 kg/m   Physical Exam Vitals and nursing note reviewed.  Constitutional:      General: He is not in acute distress.    Appearance: Normal appearance. He  is not ill-appearing.  HENT:     Head: Atraumatic.  Cardiovascular:     Rate and Rhythm: Normal rate and regular rhythm.     Pulses: Normal pulses.  Pulmonary:     Effort: Pulmonary effort is normal.  Chest:     Chest wall: No tenderness.  Musculoskeletal:     Cervical back: Normal range of motion.  Skin:    Capillary Refill: Capillary refill takes less than 2 seconds.  Neurological:     General: No focal deficit present.     Mental Status: He is alert.     Sensory: No sensory deficit.     Motor: No weakness.     ED Results / Procedures / Treatments   Labs (all labs ordered are listed, but only abnormal results are displayed) Labs Reviewed - No data to display  EKG None  Radiology DG Ankle Complete Right  Result Date: 06/30/2019 CLINICAL DATA:  Rolled the ankle EXAM: RIGHT ANKLE - COMPLETE 3+ VIEW COMPARISON:  None. FINDINGS: Acute nondisplaced fracture involving the distal fibula below the level of the ankle joint. Ankle mortise is symmetric. Soft tissue swelling is present IMPRESSION: Acute nondisplaced distal fibular fracture. Electronically Signed   By: James Hatfield M.D.   On: 06/30/2019 23:37   DG Foot Complete Right  Result Date: 06/30/2019 CLINICAL DATA:  Ankle injury with swelling EXAM: RIGHT FOOT COMPLETE - 3+ VIEW COMPARISON:  None. FINDINGS: No fracture or malalignment.  Small plantar calcaneal spur. IMPRESSION: No acute osseous abnormality Electronically Signed   By: James Hatfield M.D.   On: 06/30/2019 23:37    Procedures Procedures (including critical care time)  Medications Ordered in ED Medications - No data to display  ED Course  I have reviewed the triage vital signs and the nursing notes.  Pertinent labs & imaging results that were available during my care of the patient were reviewed by me and considered in my medical decision making (see chart for details).    MDM Rules/Calculators/A&P                      Patient with right foot and ankle  pain for 1 week.  Symptoms are secondary to a mechanical fall.  He remains neurovascularly intact.  X-ray of the right ankle shows a closed, nondisplaced distal fibula fracture.  He remains neurovascularly intact.  He has his own crutches.  Compartments are soft.  Patient placed in a cam walker.  He has an orthopedic provider in [place] that he prefers to follow-up with.   Final Clinical Impression(s) / ED Diagnoses Final diagnoses:  Closed  fracture of distal end of right fibula, unspecified fracture morphology, initial encounter    Rx / DC Orders ED Discharge Orders    None       Kem Parkinson, PA-C 07/01/19 0126    Merryl Hacker, MD 07/01/19 [phone]

## 2021-01-19 IMAGING — DX DG FOOT COMPLETE 3+V*R*
3 series · 3 of 3 positions shown · non-contrast
Comparison: None.

CLINICAL DATA: Ankle injury with swelling

EXAM:
RIGHT FOOT COMPLETE - 3+ VIEW

[foot ap]
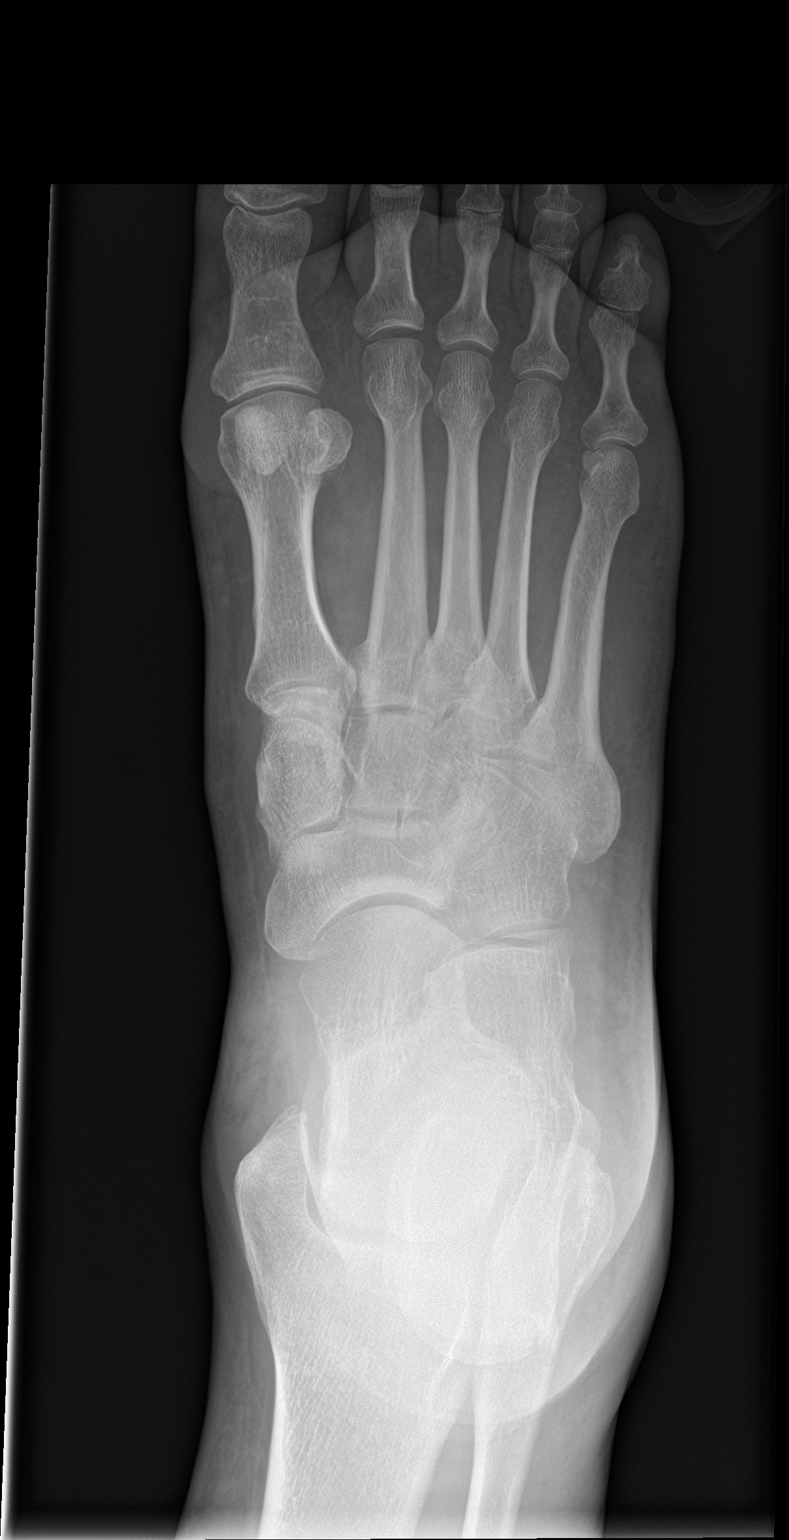

[foot obl]
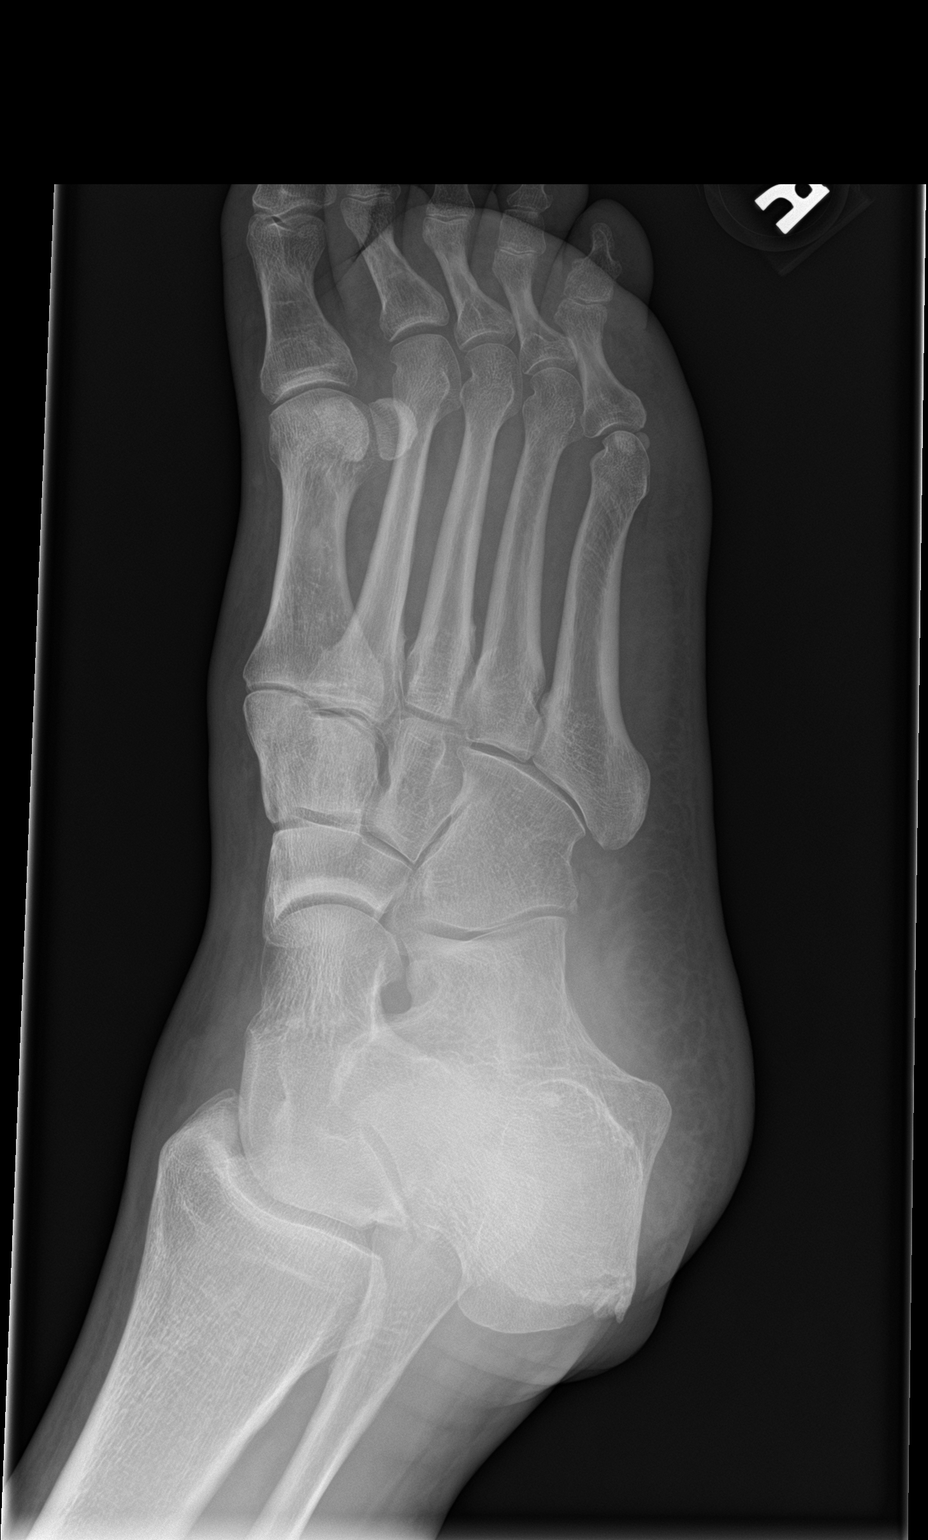

[foot lat]
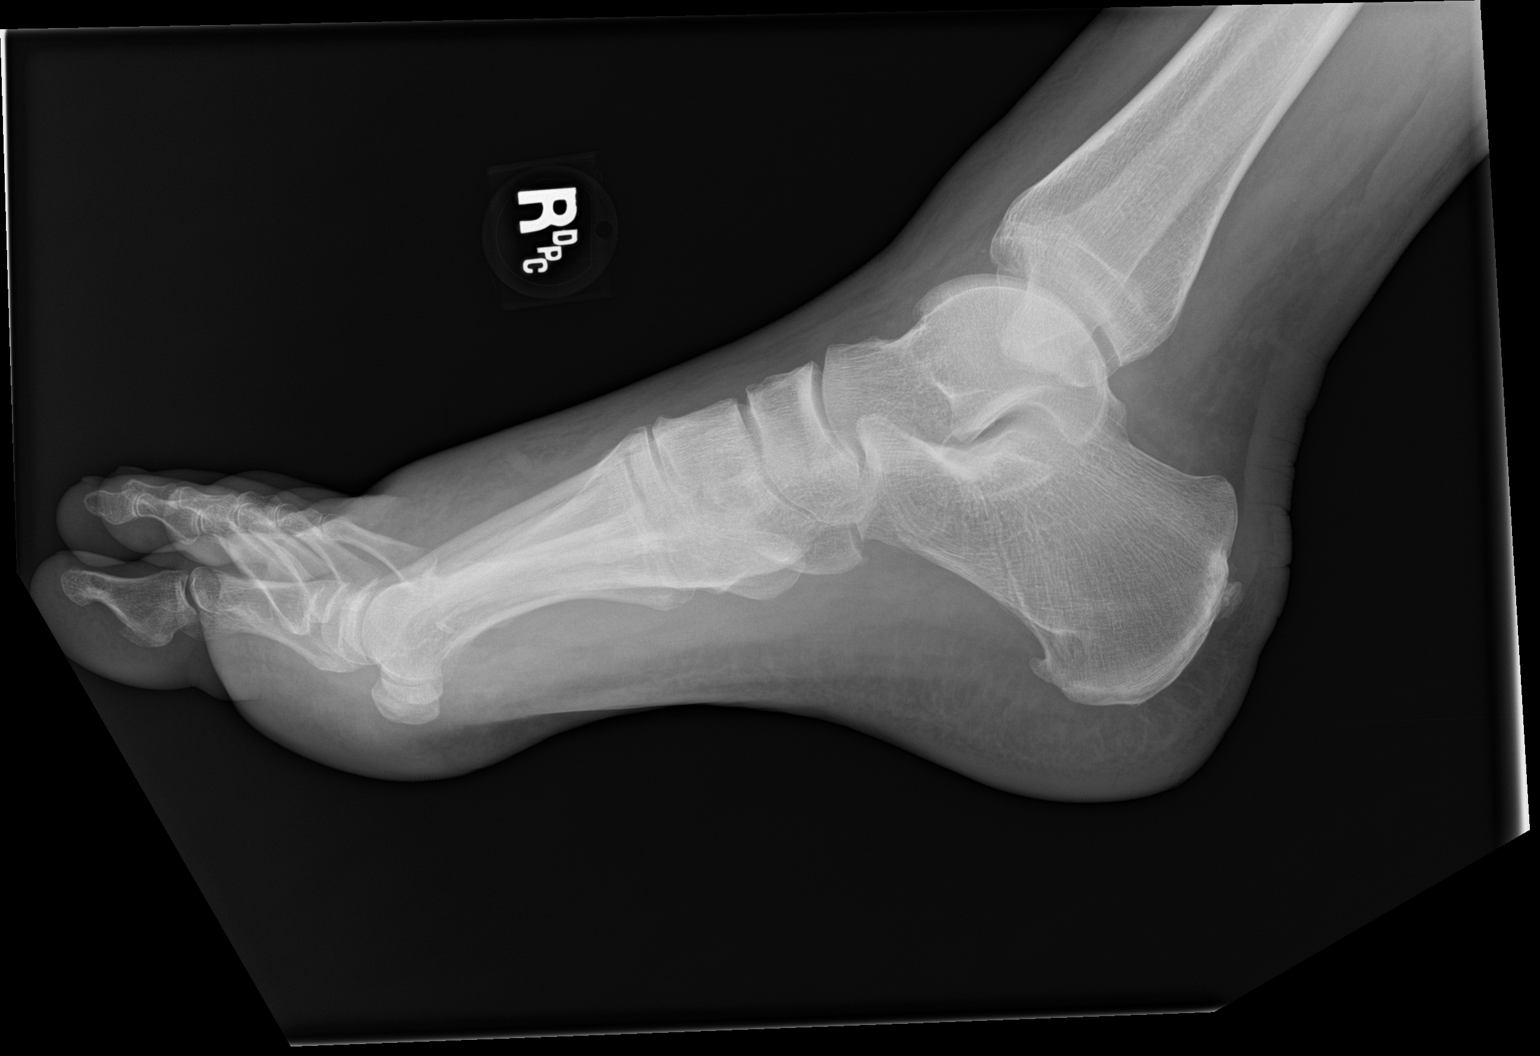

[3 of 3 positions shown; findings below may reference images not displayed]

FINDINGS: No fracture or malalignment.  Small plantar calcaneal spur.
IMPRESSION: No acute osseous abnormality

## 2021-03-28 ENCOUNTER — Other Ambulatory Visit: Payer: Self-pay

## 2021-03-28 ENCOUNTER — Ambulatory Visit (INDEPENDENT_AMBULATORY_CARE_PROVIDER_SITE_OTHER): Payer: 59 | Admitting: Podiatry

## 2021-03-28 ENCOUNTER — Ambulatory Visit (INDEPENDENT_AMBULATORY_CARE_PROVIDER_SITE_OTHER): Payer: 59

## 2021-03-28 DIAGNOSIS — M79674 Pain in right toe(s): Secondary | ICD-10-CM

## 2021-03-28 DIAGNOSIS — M79672 Pain in left foot: Secondary | ICD-10-CM

## 2021-03-28 DIAGNOSIS — M7741 Metatarsalgia, right foot: Secondary | ICD-10-CM | POA: Diagnosis not present

## 2021-03-28 DIAGNOSIS — D3610 Benign neoplasm of peripheral nerves and autonomic nervous system, unspecified: Secondary | ICD-10-CM | POA: Diagnosis not present

## 2021-03-28 DIAGNOSIS — M79671 Pain in right foot: Secondary | ICD-10-CM | POA: Diagnosis not present

## 2021-03-28 DIAGNOSIS — M7742 Metatarsalgia, left foot: Secondary | ICD-10-CM | POA: Diagnosis not present

## 2021-03-28 DIAGNOSIS — M79675 Pain in left toe(s): Secondary | ICD-10-CM | POA: Diagnosis not present

## 2021-03-28 MED ORDER — MELOXICAM 15 MG PO TABS: 15.0000 mg | ORAL_TABLET | Freq: Every day | ORAL | 0 refills | Status: DC | PRN

## 2021-03-30 NOTE — Progress Notes (Signed)
Subjective:   Patient ID: James Hatfield, male   DOB: 56 y.o.   MRN: [id number]   HPI 56 year old male presents the office today for concerns of pain to his left foot mostly in the third toe which started when he was 21.  He states he does get numbness into the toes as well mostly the second, third, fourth toes.  Over the last 3 to 4 years you have any discomfort with walking mostly in the morning reports gets out of bed.  Most the pain to the ball of his foot.  Last summer he did have some heel pain.  Denies any recent injury.  No radiating pain. No other concerns.    Review of Systems  All other systems reviewed and are negative.  Past Medical History:  Diagnosis Date   Arthritis    knees, hips   DVT (deep venous thrombosis) ([facility])    left leg    Past Surgical History:  Procedure Laterality Date   COLONOSCOPY N/A 05/01/2019   Procedure: COLONOSCOPY;  Surgeon: Rogene Houston, MD;  [facility];  Service: Endoscopy;  Laterality: N/A;  830   CYSTECTOMY     back   KNEE ARTHROSCOPY WITH MEDIAL MENISECTOMY Right 03/06/2018   Procedure: KNEE ARTHROSCOPY WITH DEBRIDEMENT AND REPAIR PARTIAL MEDIAL MENISECTOMY;  Surgeon: Corky Mull, MD;  Location: [place];  Service: Orthopedics;  Laterality: Right;  SMITH AND NEPHEW   POLYPECTOMY  05/01/2019   Procedure: POLYPECTOMY;  Surgeon: Rogene Houston, MD;  [facility];  Service: Endoscopy;;   SHOULDER ARTHROSCOPY W/ ACROMIAL REPAIR Left      Current Outpatient Medications:    meloxicam (MOBIC) 15 MG tablet, Take 1 tablet (15 mg total) by mouth daily as needed for pain., Disp: 30 tablet, Rfl: 0   aspirin EC 325 MG tablet, Take 1 tablet (325 mg total) by mouth daily., Disp: 30 tablet, Rfl: 0   atorvastatin (LIPITOR) 40 MG tablet, Take 40 mg by mouth daily., Disp: , Rfl:    calcium carbonate (TUMS - DOSED IN MG ELEMENTAL CALCIUM) 500 MG chewable tablet, Chew 1 tablet by mouth daily as needed for indigestion or  heartburn., Disp: , Rfl:    fluticasone (FLONASE) 50 MCG/ACT nasal spray, Place 1 spray into both nostrils daily. , Disp: , Rfl:    HYDROcodone-acetaminophen (NORCO/VICODIN) 5-325 MG tablet, Take one tab po q 4 hrs prn pain, Disp: 10 tablet, Rfl: 0   ibuprofen (ADVIL) 200 MG tablet, Take 600 mg by mouth every 6 (six) hours as needed for headache or moderate pain., Disp: , Rfl:    levocetirizine (XYZAL) 5 MG tablet, Take 5 mg by mouth daily. , Disp: , Rfl:   Allergies  Allergen Reactions   Penicillins Anaphylaxis and Swelling    Did it involve swelling of the face/tongue/throat, SOB, or low BP? Yes Did it involve sudden or severe rash/hives, skin peeling, or any reaction on the inside of your mouth or nose? No Did you need to seek medical attention at a hospital or doctor's office? Unknown When did it last happen?      childhood allergy If all above answers are NO, may proceed with cephalosporin use.           Objective:  Physical Exam  General: AAO x3, NAD  Dermatological: Skin is warm, dry and supple bilateral. There are no open sores, no preulcerative lesions, no rash or signs of infection present.  Vascular: Dorsalis Pedis artery  and Posterior Tibial artery pedal pulses are 2/4 bilateral with immedate capillary fill time. There is no pain with calf compression, swelling, warmth, erythema.   Neruologic: Grossly intact via light touch bilateral.  Negative Tinel sign.  Musculoskeletal: Positive metatarsal heads plantarly.  Small palpable neuroma versus possible bursa second interspace on the left foot.  No area of pinpoint tenderness identified.  No edema.  No pain with MPJ range of motion.  No other area discomfort identified at this time.  No pain with Achilles tendon or plantar fashion today.  Flexor, extensor tendons appear to be intact.  Muscular strength 5/5 in all groups tested bilateral.  Gait: Unassisted, Nonantalgic.       Assessment:   Metatarsalgia, possible  neuroma      Plan:  -Treatment options discussed including all alternatives, risks, and complications -Etiology of symptoms were discussed -X-rays were obtained and reviewed with the patient.  There is no evidence of acute fracture or stress fracture identified today. -Prescribed mobic. Discussed side effects of the medication and directed to stop if any are to occur and call the office.  -Metatarsal pads were dispensed and applied to his shoes.  Monitor we discussed inserts.  Discussed steroid injection today but we held off on this. -If no improvement, MRI  Trula Slade DPM

## 2021-05-26 ENCOUNTER — Other Ambulatory Visit: Payer: Self-pay | Admitting: Podiatry

## 2021-05-26 NOTE — Telephone Encounter (Signed)
Duplicate therapy with ibuprofen, please advise

## 2021-08-09 ENCOUNTER — Other Ambulatory Visit: Payer: Self-pay | Admitting: Podiatry

## 2021-10-06 ENCOUNTER — Other Ambulatory Visit: Payer: Self-pay | Admitting: Podiatry

## 2021-11-07 ENCOUNTER — Other Ambulatory Visit: Payer: Self-pay | Admitting: Podiatry

## 2022-02-23 ENCOUNTER — Other Ambulatory Visit (HOSPITAL_COMMUNITY): Payer: Self-pay | Admitting: Internal Medicine

## 2022-02-23 DIAGNOSIS — E782 Mixed hyperlipidemia: Secondary | ICD-10-CM

## 2022-03-30 ENCOUNTER — Ambulatory Visit (HOSPITAL_COMMUNITY)
Admission: RE | Admit: 2022-03-30 | Discharge: 2022-03-30 | Disposition: A | Discharge status: Home / Self Care | Payer: Self-pay | Source: Ambulatory Visit | Attending: Internal Medicine | Admitting: Internal Medicine

## 2022-03-30 DIAGNOSIS — E782 Mixed hyperlipidemia: Secondary | ICD-10-CM | POA: Insufficient documentation

## 2023-05-14 ENCOUNTER — Ambulatory Visit (INDEPENDENT_AMBULATORY_CARE_PROVIDER_SITE_OTHER): Payer: 59 | Admitting: Otolaryngology

## 2023-05-14 ENCOUNTER — Encounter (INDEPENDENT_AMBULATORY_CARE_PROVIDER_SITE_OTHER): Payer: Self-pay | Admitting: Otolaryngology

## 2023-05-14 VITALS — BP 163/98 | HR 64

## 2023-05-14 DIAGNOSIS — J3489 Other specified disorders of nose and nasal sinuses: Secondary | ICD-10-CM | POA: Diagnosis not present

## 2023-05-14 DIAGNOSIS — J342 Deviated nasal septum: Secondary | ICD-10-CM

## 2023-05-14 DIAGNOSIS — J339 Nasal polyp, unspecified: Secondary | ICD-10-CM | POA: Diagnosis not present

## 2023-05-14 DIAGNOSIS — J329 Chronic sinusitis, unspecified: Secondary | ICD-10-CM | POA: Diagnosis not present

## 2023-05-14 DIAGNOSIS — R0981 Nasal congestion: Secondary | ICD-10-CM | POA: Diagnosis not present

## 2023-05-14 DIAGNOSIS — J3089 Other allergic rhinitis: Secondary | ICD-10-CM

## 2023-05-14 DIAGNOSIS — J343 Hypertrophy of nasal turbinates: Secondary | ICD-10-CM

## 2023-05-14 NOTE — Progress Notes (Signed)
 ENT CONSULT:  Reason for Consult: OSA and chronic nasal congestion mouth breathing   HPI: Discussed the use of AI scribe software for clinical note transcription with the patient, who gave verbal consent to proceed.  History of Present Illness   James Hatfield is a 58 year old male who presents with shortness of breath and chronic nasal congestion. He was referred by Dr. Margo Aye for evaluation of his mouth breathing and nasal congestion/obstruction.  He experiences significant nasal congestion/trouble breathing through his nose, affecting both his waking and sleeping hours. Despite using nasal sprays, he struggles to breathe adequately and primarily resorts to mouth breathing. No history of lung disease, including smoking, COPD, or asthma. No recent weight changes. He has been informed of having extremely small nasal passages, contributing to his breathing difficulties. He uses Flonase and levocetirizine daily to manage his symptoms. He has not undergone any nasal or sinus surgeries and still has his tonsils.  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  He has a history of sinus infections, requiring antibiotics approximately once a year in the past, though the frequency has decreased since starting Flonase.  Approximately six months ago, he was evaluated for low energy and poor sleep quality, leading to a home sleep study that revealed mild to moderate sleep apnea. A mouth guard was recommended, but he did not pursue this treatment as he was more concerned about his nasal congestion. CPAP was not offered to him.     Past Medical History:  Diagnosis Date   Arthritis    knees, hips   DVT (deep venous thrombosis) (HCC)    left leg    Past Surgical History:  Procedure Laterality Date   COLONOSCOPY N/A 05/01/2019    Procedure: COLONOSCOPY;  Surgeon: Malissa Hippo, MD;  [facility];  Service: Endoscopy;  Laterality: N/A;  830   CYSTECTOMY     back   KNEE ARTHROSCOPY WITH MEDIAL MENISECTOMY Right 03/06/2018   Procedure: KNEE ARTHROSCOPY WITH DEBRIDEMENT AND REPAIR PARTIAL MEDIAL MENISECTOMY;  Surgeon: Christena Flake, MD;  Location: [facility];  Service: Orthopedics;  Laterality: Right;  SMITH AND NEPHEW   POLYPECTOMY  05/01/2019   Procedure: POLYPECTOMY;  Surgeon: Malissa Hippo, MD;  Location: [facility];  Service: Endoscopy;;   SHOULDER ARTHROSCOPY W/ ACROMIAL REPAIR Left     History reviewed. No pertinent family history.  Social History:  reports that he quit smoking about 20 years ago. His smoking use included cigarettes. He has never used smokeless tobacco. He reports current alcohol use of about 24.0 standard drinks of alcohol per week. He reports that he does not use drugs.  Allergies:  Allergies  Allergen Reactions   Penicillins Anaphylaxis and Swelling    Did it involve swelling of the face/tongue/throat, SOB, or low BP? Yes Did it involve sudden or severe rash/hives, skin peeling, or any reaction on the inside of your mouth or nose? No Did you need to seek medical attention at a hospital or doctor's office? Unknown When did it last happen?      childhood allergy If all above answers are "NO", may proceed with cephalosporin use.     Medications: I have reviewed the patient's current medications.  The PMH, PSH, Medications, Allergies, and SH were reviewed and updated.  ROS: Constitutional: Negative for fever, weight loss and  weight gain. Cardiovascular: Negative for chest pain and dyspnea on exertion. Respiratory: Is not experiencing shortness of breath at rest. Gastrointestinal: Negative for nausea and vomiting. Neurological: Negative for headaches. Psychiatric: The patient is not nervous/anxious  Blood pressure (!) 163/98, pulse 64, SpO2 96%. There is no  height or weight on file to calculate BMI.  PHYSICAL EXAM:  Exam: General: Well-developed, well-nourished Communication and Voice: Clear pitch and clarity Respiratory Respiratory effort: Equal inspiration and expiration without stridor Cardiovascular Peripheral Vascular: Warm extremities with equal color/perfusion Eyes: No nystagmus with equal extraocular motion bilaterally Neuro/Psych/Balance: Patient oriented to person, place, and time; Appropriate mood and affect; Gait is intact with no imbalance; Cranial nerves I-XII are intact Head and Face Inspection: Normocephalic and atraumatic without mass or lesion Palpation: Facial skeleton intact without bony stepoffs Salivary Glands: No mass or tenderness Facial Strength: Facial motility symmetric and full bilaterally ENT Pinna: External ear intact and fully developed External canal: Canal is patent with intact skin Tympanic Membrane: Clear and mobile External Nose: No scar or anatomic deformity Internal Nose: Septum is deviated and S-shaped significant narrowing of the Right > Left nasal passages and right nasal polyp, no frank purulence. Mucosal edema and erythema present, severe Bilateral inferior turbinate hypertrophy.  Lips, Teeth, and gums: Mucosa and teeth intact and viable TMJ: No pain to palpation with full mobility Oral cavity/oropharynx: No erythema or exudate, no lesions present Nasopharynx: No mass or lesion with intact mucosa Hypopharynx: Intact mucosa without pooling of secretions Larynx Glottic: Full true vocal cord mobility without lesion or mass Supraglottic: Normal appearing epiglottis and AE folds Interarytenoid Space: Moderate pachydermia&edema Subglottic Space: Patent without lesion or edema Neck Neck and Trachea: Midline trachea without mass or lesion Thyroid: No mass or nodularity Lymphatics: No lymphadenopathy  Procedure:   PROCEDURE NOTE: nasal endoscopy  Preoperative diagnosis: chronic sinusitis  symptoms  Postoperative diagnosis: same  Procedure: Diagnostic nasal endoscopy (56213)  Surgeon: Ashok Croon, M.D.  Anesthesia: Topical lidocaine and Afrin  H&P REVIEW: The patient's history and physical were reviewed today prior to procedure. All medications were reviewed and updated as well. Complications: None Condition is stable throughout exam Indications and consent: The patient presents with symptoms of chronic sinusitis not responding to previous therapies. All the risks, benefits, and potential complications were reviewed with the patient preoperatively and informed consent was obtained. The time out was completed with confirmation of the correct procedure.   Procedure: The patient was seated upright in the clinic. Topical lidocaine and Afrin were applied to the nasal cavity. After adequate anesthesia had occurred, the rigid nasal endoscope was passed into the nasal cavity. The nasal mucosa, turbinates, septum, and sinus drainage pathways were visualized bilaterally. This revealed no purulence or significant secretions that might be cultured. There was a nasal polyp on the right side. The mucosa was intact and there was no crusting present. The scope was then slowly withdrawn and the patient tolerated the procedure well. There were no complications or blood loss.   Assessment/Plan: Encounter Diagnoses  Name Primary?   Chronic sinusitis, unspecified location Yes   Sinusitis with nasal polyps    Environmental and seasonal allergies    Chronic nasal congestion    Nasal polyp    Nasal obstruction    Nasal septal deviation    Hypertrophy of both inferior nasal turbinates     Assessment and Plan    Chronic Nasal Congestion/nasal obstruction with Nasal Polyps on the right and Septal Deviation/ITH Chronic nasal congestion with a deviated septum  and nasal polyps, particularly on the right side, likely secondary to chronic allergies. Symptoms include difficulty breathing through  the nose, mouth breathing, and postnasal drainage. Recurrent sinus infections have decreased with Flonase and levocetirizine. Discussed need for sinus CT to evaluate chronic sinus disease and extent of nasal polyps and septal deviation. Explained that nasal polyps are typically a reaction to chronic allergies and may require surgical intervention. Discussed referral to an allergist for allergy testing and potential allergy shots for long-term treatment. Mentioned medications like Dupixent or Xolair to stall further growth of nasal polyps. Emphasized continuing current medications and considering nasal rinses with steroids based on scan results. - Order sinus CT scan - Refer to allergist for allergy testing and potential allergy shots if candidate - Continue Flonase 2 puffs b/l nares twice daily - Continue Xyzal 5 mg daily - Order CBC and total IgE - Schedule follow-up in a couple of months to review results and discuss further management.     Thank you for allowing me to participate in the care of this patient. Please do not hesitate to contact me with any questions or concerns.   Ashok Croon, MD Otolaryngology [facility] Phone: [phone] Fax: [phone]    05/14/2023, 7:31 PM

## 2023-06-11 ENCOUNTER — Other Ambulatory Visit: Payer: Self-pay

## 2023-06-11 ENCOUNTER — Ambulatory Visit (INDEPENDENT_AMBULATORY_CARE_PROVIDER_SITE_OTHER): Payer: 59 | Admitting: Internal Medicine

## 2023-06-11 ENCOUNTER — Encounter: Payer: Self-pay | Admitting: Internal Medicine

## 2023-06-11 VITALS — BP 118/80 | HR 83 | Temp 98.3°F | Resp 16 | Ht 73.23 in | Wt 291.3 lb

## 2023-06-11 DIAGNOSIS — J330 Polyp of nasal cavity: Secondary | ICD-10-CM

## 2023-06-11 DIAGNOSIS — J328 Other chronic sinusitis: Secondary | ICD-10-CM

## 2023-06-11 DIAGNOSIS — J3089 Other allergic rhinitis: Secondary | ICD-10-CM | POA: Diagnosis not present

## 2023-06-11 MED ORDER — FLUTICASONE PROPIONATE 50 MCG/ACT NA SUSP: 2.0000 | Freq: Two times a day (BID) | NASAL | 5 refills | Status: AC

## 2023-06-11 MED ORDER — LEVOCETIRIZINE DIHYDROCHLORIDE 5 MG PO TABS: 5.0000 mg | ORAL_TABLET | Freq: Every day | ORAL | 5 refills | Status: AC

## 2023-06-11 NOTE — Progress Notes (Signed)
 NEW PATIENT  Date of Service/Encounter:  06/11/23  Consult requested by: Benita Stabile, MD   Subjective:   James Hatfield (DOB: 1965/12/22) is a 58 y.o. male who presents to the clinic on 06/11/2023 with a chief complaint of Nasal Congestion (Excessive sneezing, sinus drainage) and Breathing Problem (Shortness of breath) .    History obtained from: chart review and patient.   Rhinitis/Sinusitis:  Started in junior high school.  Symptoms include: nasal congestion, trouble breathing through the nose, post nasal drainage, sneezing, sense of smell is okay  Many years ago about 17+, had frequent sinus infection requiring multiple courses but this has improved in the last 2 years since starting Flonase.  No sinus infections this year but previous year did require antibiotics x1.  Occurs year-round with seasonal flares in Spring/Summer  Potential triggers: not sure   Treatments tried:  Xyzal and Flonase daily  Previous allergy testing: no History of sinus surgery: no Nonallergic triggers: none      Reviewed:  2/24/202: see by ENT for congestion, trouble breathing through nose, post nasal drainage, recurrent sinus infections on abx.Also with OSA not on any treatment. Started on Flonase, Xyzal.  Plan for allergy testing.  Discussed biologic therapy   Nasal Endoscopy 04/2023: The nasal mucosa, turbinates, septum, and sinus drainage pathways were visualized bilaterally. This revealed no purulence or significant secretions that might be cultured. There was a nasal polyp on the right side. The mucosa was intact and there was no crusting present. The scope was then slowly withdrawn and the patient tolerated the procedure well. There were no complications or blood loss.   03/28/2023: seen by PCP Dr Margo Aye with PMH of HLD, Obesity, OSA, allergic rhinitis, diverticulitis.  On Flonase, Xyzal, Meloxicam, Vascepa, Lipitor. Referred to ENT to discuss tx options for OSA.    Past Medical History: Past  Medical History:  Diagnosis Date   Arthritis    knees, hips   DVT (deep venous thrombosis) (HCC)    left leg   Past Surgical History: Past Surgical History:  Procedure Laterality Date   COLONOSCOPY N/A 05/01/2019   Procedure: COLONOSCOPY;  Surgeon: Malissa Hippo, MD;  [facility]: [facility];  Service: Endoscopy;  Laterality: N/A;  830   CYSTECTOMY     back   KNEE ARTHROSCOPY WITH MEDIAL MENISECTOMY Right 03/06/2018   Procedure: KNEE ARTHROSCOPY WITH DEBRIDEMENT AND REPAIR PARTIAL MEDIAL MENISECTOMY;  Surgeon: Christena Flake, MD;  Location: [facility];  Service: Orthopedics;  Laterality: Right;  SMITH AND NEPHEW   POLYPECTOMY  05/01/2019   Procedure: POLYPECTOMY;  Surgeon: Malissa Hippo, MD;  Location: [facility];  Service: Endoscopy;;   SHOULDER ARTHROSCOPY W/ ACROMIAL REPAIR Left     Family History: History reviewed. No pertinent family history.  Social History:  Flooring in bedroom: carpet Pets: dog Tobacco use/exposure: none Job: landscaper   Medication List:  Allergies as of 06/11/2023       Reactions   Penicillins Anaphylaxis, Swelling   Did it involve swelling of the face/tongue/throat, SOB, or low BP? Yes Did it involve sudden or severe rash/hives, skin peeling, or any reaction on the inside of your mouth or nose? No Did you need to seek medical attention at a hospital or doctor's office? Unknown When did it last happen?      childhood allergy If all above answers are "NO", may proceed with cephalosporin use.        Medication List  Accurate as of June 11, 2023 10:08 AM. If you have any questions, ask your nurse or doctor.          aspirin EC 325 MG tablet Take 1 tablet (325 mg total) by mouth daily.   atorvastatin 40 MG tablet Commonly known as: LIPITOR Take 40 mg by mouth daily.   calcium carbonate 500 MG chewable tablet Commonly known as: TUMS - dosed in mg elemental calcium Chew 1 tablet by mouth daily as needed for  indigestion or heartburn.   fluticasone 50 MCG/ACT nasal spray Commonly known as: FLONASE Place 1 spray into both nostrils daily.   HYDROcodone-acetaminophen 5-325 MG tablet Commonly known as: NORCO/VICODIN Take one tab po q 4 hrs prn pain   ibuprofen 200 MG tablet Commonly known as: ADVIL Take 600 mg by mouth every 6 (six) hours as needed for headache or moderate pain.   levocetirizine 5 MG tablet Commonly known as: XYZAL Take 5 mg by mouth daily.   meloxicam 15 MG tablet Commonly known as: MOBIC TAKE ONE TABLET (15MG  TOTAL) BY MOUTH DAILY AS NEEDED FOR PAIN   omega-3 acid ethyl esters 1 g capsule Commonly known as: LOVAZA Take 2 capsules by mouth 2 (two) times daily.         REVIEW OF SYSTEMS: Pertinent positives and negatives discussed in HPI.   Objective:   Physical Exam: BP 118/80   Pulse 83   Temp 98.3 F (36.8 C)   Resp 16   Ht 6' 1.23" (1.86 m)   Wt 291 lb 4.8 oz (132.1 kg)   SpO2 93%   BMI 38.19 kg/m  Body mass index is 38.19 kg/m. GEN: alert, well developed HEENT: clear conjunctiva, nose with + moderate inferior turbinate hypertrophy, pink nasal mucosa, + clear rhinorrhea, + cobblestoning HEART: regular rate and rhythm, no murmur LUNGS: clear to auscultation bilaterally, no coughing, unlabored respiration ABDOMEN: soft, non distended  SKIN: no rashes or lesions  Assessment:   1. Other allergic rhinitis   2. Other chronic sinusitis   3. Polyp of nasal cavity     Plan/Recommendations:  Chronic Sinusitis with Nasal Polyps Other Allergic Rhinitis - Due to turbinate hypertrophy, seasonal worsening, nasal polyps and unresponsive to over the counter meds, will perform skin testing to identify aeroallergen triggers.   - Nasal Endoscopy 04/2023: The nasal mucosa, turbinates, septum, and sinus drainage pathways without purulence or significant secretions that might be cultured. There was a nasal polyp on the right side. The mucosa was intact and there  was no crusting present.  - Use nasal saline rinses before nose sprays such as with Neilmed Sinus Rinse.  Use distilled water.   - Use Flonase 2 sprays each nostril twice daily. Aim upward and outward. - Use Xyzal 5 mg daily.    Hold all anti-histamines (Xyzal, Allegra, Zyrtec, Claritin, Benadryl, Pepcid) 3 days prior to next visit.   Follow up: 830 AM on 3/31 for skin testing 1-55   Alesia Morin, MD [facility]

## 2023-06-11 NOTE — Patient Instructions (Addendum)
 Chronic Sinusitis with Nasal Polyps Other Allergic Rhinitis - Use nasal saline rinses before nose sprays such as with Neilmed Sinus Rinse.  Use distilled water.   - Use Flonase 2 sprays each nostril twice daily. Aim upward and outward. - Use Xyzal 5 mg daily.    Hold all anti-histamines (Xyzal, Allegra, Zyrtec, Claritin, Benadryl, Pepcid) 3 days prior to next visit.   Follow up: 830 AM on 3/31 for skin testing 1-55

## 2023-06-18 ENCOUNTER — Ambulatory Visit (INDEPENDENT_AMBULATORY_CARE_PROVIDER_SITE_OTHER): Admitting: Internal Medicine

## 2023-06-18 DIAGNOSIS — J3089 Other allergic rhinitis: Secondary | ICD-10-CM

## 2023-06-18 MED ORDER — AZELASTINE HCL 0.1 % NA SOLN: 2.0000 | Freq: Two times a day (BID) | NASAL | 5 refills | Status: AC | PRN

## 2023-06-18 NOTE — Patient Instructions (Addendum)
 Chronic Sinusitis with Nasal Polyps Allergic Rhinitis - SPT 05/2023: positive to dust mite  - Use nasal saline rinses before nose sprays such as with Neilmed Sinus Rinse.  Use distilled water.   - Use Flonase 2 sprays each nostril twice daily. Aim upward and outward. - Use Azelastine 2 sprays each nostril twice daily as needed for congestion, drainage, runny nose.  Aim upward and outward.   - Use Xyzal 5 mg daily.  - Consider allergy shots as long term control of your symptoms by teaching your immune system to be more tolerant of your allergy triggers   ALLERGEN AVOIDANCE MEASURES   Dust Mites Use central air conditioning and heat; and change the filter monthly.  Pleated filters work better than mesh filters.  Electrostatic filters may also be used; wash the filter monthly.  Window air conditioners may be used, but do not clean the air as well as a central air conditioner.  Change or wash the filter monthly. Keep windows closed.  Do not use attic fans.   Encase the mattress, box springs and pillows with zippered, dust proof covers. Wash the bed linens in hot water weekly.   Remove carpet, especially from the bedroom. Remove stuffed animals, throw pillows, dust ruffles, heavy drapes and other items that collect dust from the bedroom. Do not use a humidifier.   Use wood, vinyl or leather furniture instead of cloth furniture in the bedroom. Keep the indoor humidity at 30 - 40%.

## 2023-06-18 NOTE — Progress Notes (Signed)
 FOLLOW UP Date of Service/Encounter:  06/18/23   Subjective:  James Hatfield (DOB: April 11, 1965) is a 58 y.o. male who [facility] follow up for skin testing.   History obtained from: chart review and patient.  Anti histamines held.  IDs were not performed as he does not tolerate needles.    Past Medical History: Past Medical History:  Diagnosis Date   Arthritis    knees, hips   DVT (deep venous thrombosis) (HCC)    left leg    Objective:  There were no vitals taken for this visit. There is no height or weight on file to calculate BMI. Physical Exam: GEN: alert, well developed HEENT: clear conjunctiva, MMM LUNGS: unlabored respiration  Skin Testing:  Skin prick testing was placed, which includes aeroallergens/foods, histamine control, and saline control.  Verbal consent was obtained prior to placing test.  Patient tolerated procedure well.  Allergy testing results were read and interpreted by myself, documented by clinical staff. Adequate positive and negative control.  Positive results to:  Results discussed with patient/family.  Airborne Adult Perc - 06/18/23 0834     Time Antigen Placed 0834    Allergen Manufacturer Waynette Buttery    Location Back    Number of Test 55    1. Control-Buffer 50% Glycerol Negative    2. Control-Histamine 3+    3. Bahia Negative    4. French Southern Territories Negative    5. Johnson Negative    6. Kentucky Blue Negative    7. Meadow Fescue Negative    8. Perennial Rye Negative    9. Timothy Negative    10. Ragweed Mix Negative    11. Cocklebur Negative    12. Plantain,  English Negative    13. Baccharis Negative    14. Dog Fennel Negative    15. Russian Thistle Negative    16. Lamb's Quarters Negative    17. Sheep Sorrell Negative    18. Rough Pigweed Negative    19. Marsh Elder, Rough Negative    20. Mugwort, Common Negative    21. Box, Elder Negative    22. Cedar, red Negative    23. Sweet Gum  Negative    24. Pecan Pollen Negative    25. Pine Mix Negative    26. Walnut, Black Pollen Negative    27. Red Mulberry Negative    28. Ash Mix Negative    29. Birch Mix Negative    30. Beech American Negative    31. Cottonwood, Guinea-Bissau Negative    32. Hickory, White Negative    33. Maple Mix Negative    34. Oak, Guinea-[place]issau Mix Negative    35. Sycamore Eastern Negative    36. Alternaria Alternata Negative    37. Cladosporium Herbarum Negative    38. Aspergillus Mix Negative    39. Penicillium Mix Negative    40. Bipolaris Sorokiniana (Helminthosporium) Negative    41. Drechslera Spicifera (Curvularia) Negative    42. Mucor Plumbeus Negative    43. Fusarium Moniliforme Negative    44. Aureobasidium Pullulans (pullulara) Negative    45. Rhizopus Oryzae Negative    46. Botrytis Cinera Negative    47. Epicoccum Nigrum Negative    48. Phoma Betae Negative    49. Dust Mite Mix 3+    50. Cat Hair 10,000 BAU/ml Negative    51.  Dog Epithelia Negative    52. Mixed Feathers Negative    53. Horse Epithelia Negative  54. Cockroach, German Negative    55. Tobacco Leaf Negative              Assessment:   1. Allergic rhinitis due to dust mite     Plan/Recommendations:  Chronic Sinusitis with Nasal Polyps Allergic Rhinitis - Due to turbinate hypertrophy, seasonal worsening, nasal polyps and unresponsive to over the counter meds, will perform skin testing to identify aeroallergen triggers.   - Nasal Endoscopy 04/2023: The nasal mucosa, turbinates, septum, and sinus drainage pathways without purulence or significant secretions that might be cultured. There was a nasal polyp on the right side. The mucosa was intact and there was no crusting present.  - Use nasal saline rinses before nose sprays such as with Neilmed Sinus Rinse.  Use distilled water.   - Use Flonase 2 sprays each nostril twice daily. Aim upward and outward. - Use Azelastine 2 sprays each nostril twice daily as needed  for congestion, drainage, runny nose.  Aim upward and outward.   - Use Xyzal 5 mg daily.  - Consider allergy shots as long term control of your symptoms by teaching your immune system to be more tolerant of your allergy triggers.  He is unfortunately shot/needle aversive.    ALLERGEN AVOIDANCE MEASURES   Dust Mites Use central air conditioning and heat; and change the filter monthly.  Pleated filters work better than mesh filters.  Electrostatic filters may also be used; wash the filter monthly.  Window air conditioners may be used, but do not clean the air as well as a central air conditioner.  Change or wash the filter monthly. Keep windows closed.  Do not use attic fans.   Encase the mattress, box springs and pillows with zippered, dust proof covers. Wash the bed linens in hot water weekly.   Remove carpet, especially from the bedroom. Remove stuffed animals, throw pillows, dust ruffles, heavy drapes and other items that collect dust from the bedroom. Do not use a humidifier.   Use wood, vinyl or leather furniture instead of cloth furniture in the bedroom. Keep the indoor humidity at 30 - 40%.     Return in about 6 months (around 12/18/2023).  Alesia Morin, MD [facility]

## 2023-07-10 ENCOUNTER — Ambulatory Visit (HOSPITAL_COMMUNITY)
Admission: RE | Admit: 2023-07-10 | Discharge: 2023-07-10 | Disposition: A | Discharge status: Home / Self Care | Source: Ambulatory Visit | Attending: Otolaryngology | Admitting: Otolaryngology

## 2023-07-10 DIAGNOSIS — J329 Chronic sinusitis, unspecified: Secondary | ICD-10-CM | POA: Diagnosis present

## 2023-07-10 DIAGNOSIS — J339 Nasal polyp, unspecified: Secondary | ICD-10-CM | POA: Diagnosis present

## 2023-07-10 LAB — CBC WITH DIFFERENTIAL/PLATELET
Absolute Lymphocytes: 2087 {cells}/uL (ref 850–3900)
Absolute Monocytes: 611 {cells}/uL (ref 200–950)
Basophils Absolute: 71 {cells}/uL (ref 0–200)
Basophils Relative: 1 %
Eosinophils Absolute: 447 {cells}/uL (ref 15–500)
Eosinophils Relative: 6.3 %
HCT: 44.5 % (ref 38.5–50.0)
Hemoglobin: 15.1 g/dL (ref 13.2–17.1)
MCH: 31.6 pg (ref 27.0–33.0)
MCHC: 33.9 g/dL (ref 32.0–36.0)
MCV: 93.1 fL (ref 80.0–100.0)
MPV: 10.1 fL (ref 7.5–12.5)
Monocytes Relative: 8.6 %
Neutro Abs: 3884 {cells}/uL (ref 1500–7800)
Neutrophils Relative %: 54.7 %
Platelets: 148 10*3/uL (ref 140–400)
RBC: 4.78 10*6/uL (ref 4.20–5.80)
RDW: 12.7 % (ref 11.0–15.0)
Total Lymphocyte: 29.4 %
WBC: 7.1 10*3/uL (ref 3.8–10.8)

## 2023-07-10 LAB — IGE: IgE (Immunoglobulin E), Serum: 102 kU/L (ref <=114)

## 2023-07-12 ENCOUNTER — Encounter (INDEPENDENT_AMBULATORY_CARE_PROVIDER_SITE_OTHER): Payer: Self-pay | Admitting: Otolaryngology

## 2023-07-12 ENCOUNTER — Ambulatory Visit (INDEPENDENT_AMBULATORY_CARE_PROVIDER_SITE_OTHER): Payer: 59 | Admitting: Otolaryngology

## 2023-07-12 VITALS — BP 142/83 | HR 57

## 2023-07-12 DIAGNOSIS — J3489 Other specified disorders of nose and nasal sinuses: Secondary | ICD-10-CM | POA: Diagnosis not present

## 2023-07-12 DIAGNOSIS — J343 Hypertrophy of nasal turbinates: Secondary | ICD-10-CM

## 2023-07-12 DIAGNOSIS — J342 Deviated nasal septum: Secondary | ICD-10-CM | POA: Diagnosis not present

## 2023-07-12 DIAGNOSIS — R0981 Nasal congestion: Secondary | ICD-10-CM

## 2023-07-12 DIAGNOSIS — G4733 Obstructive sleep apnea (adult) (pediatric): Secondary | ICD-10-CM

## 2023-07-12 NOTE — Patient Instructions (Signed)
 Lloyd Huger Med Nasal Saline Rinse   - start nasal saline rinses with NeilMed Bottle available over the counter or online to help with nasal congestion

## 2023-07-12 NOTE — Progress Notes (Signed)
 ENT Progress Note:   Update 07/12/2023  Discussed the use of AI scribe software for clinical note transcription with the patient, who gave verbal consent to proceed.  History of Present Illness James Hatfield is a 58 year old male who presents for f/u of chronic nasal obstruction/nasal congestion and mouth breathing.  He has experienced chronic nasal obstruction throughout his life, characterized by nasal congestion and difficulty breathing through his nose. A recent CT scan of the sinuses showed a deviation of the nasal septum and inferior turbinate hypertrophy but no chronic sinusitis.  He has been evaluated by allergist and was found to have a possible allergy  to dust mites, but other allergy  tests were negative. He currently uses Flonase  nasal spray to manage his symptoms, which provides some relief. He has not tried nasal saline rinses.  He works in Theatre stage manager care, which involves significant physical activity. He is concerned about the impact of his nasal symptoms on his work, especially considering the physical nature of his job. No history of stroke or heart attack and is not on blood thinners. His current medication includes a cholesterol-lowering drug.   Records Reviewed:  Initial Evaluation  Reason for Consult: OSA and chronic nasal congestion mouth breathing   HPI: Discussed the use of AI scribe software for clinical note transcription with the patient, who gave verbal consent to proceed.  History of Present Illness   James Hatfield is a 58 year old male who presents with shortness of breath and chronic nasal congestion. He was referred by Dr. Del Favia for evaluation of his mouth breathing and nasal congestion/obstruction.  He experiences significant nasal congestion/trouble breathing through his nose, affecting both his waking and sleeping hours. Despite using nasal sprays, he struggles to breathe adequately and primarily resorts to mouth breathing. No history of lung  disease, including smoking, COPD, or asthma. No recent weight changes. He has been informed of having extremely small nasal passages, contributing to his breathing difficulties. He uses Flonase  and levocetirizine daily to manage his symptoms. He has not undergone any nasal or sinus surgeries and still has his tonsils.  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  He has a history of sinus infections, requiring antibiotics approximately once a year in the past, though the frequency has decreased since starting Flonase .  Approximately six months ago, he was evaluated for low energy and poor sleep quality, leading to a home sleep study that revealed mild to moderate sleep apnea. A mouth guard was recommended, but he did not pursue this treatment as he was more concerned about his nasal congestion. CPAP was not offered to him.     Past Medical History:  Diagnosis Date   Arthritis    knees, hips   DVT (deep venous thrombosis) (HCC)    left leg    Past Surgical History:  Procedure Laterality Date   COLONOSCOPY N/A 05/01/2019   Procedure: COLONOSCOPY;  Surgeon: Ruby Corporal, MD;  [facility];  Service: Endoscopy;  Laterality: N/A;  830   CYSTECTOMY     back   KNEE ARTHROSCOPY WITH MEDIAL MENISECTOMY Right 03/06/2018   Procedure: KNEE ARTHROSCOPY WITH DEBRIDEMENT AND REPAIR PARTIAL MEDIAL MENISECTOMY;  Surgeon: Elner Hahn, MD;  Location: [facility];  Service: Orthopedics;  Laterality: Right;  SMITH AND NEPHEW   POLYPECTOMY  05/01/2019  Procedure: POLYPECTOMY;  Surgeon: Ruby Corporal, MD;  Location: [facility];  Service: Endoscopy;;   SHOULDER ARTHROSCOPY W/ ACROMIAL REPAIR Left     History reviewed. No pertinent family history.  Social History:  reports that he quit smoking about 20 years  ago. His smoking use included cigarettes. He has never used smokeless tobacco. He reports current alcohol use of about 24.0 standard drinks of alcohol per week. He reports current drug use. Drug: Marijuana.  Allergies:  Allergies  Allergen Reactions   Penicillins Anaphylaxis and Swelling    Did it involve swelling of the face/tongue/throat, SOB, or low BP? Yes Did it involve sudden or severe rash/hives, skin peeling, or any reaction on the inside of your mouth or nose? No Did you need to seek medical attention at a hospital or doctor's office? Unknown When did it last happen?      childhood allergy  If all above answers are "NO", may proceed with cephalosporin use.     Medications: I have reviewed the patient's current medications.  The PMH, PSH, Medications, Allergies, and SH were reviewed and updated.  ROS: Constitutional: Negative for fever, weight loss and weight gain. Cardiovascular: Negative for chest pain and dyspnea on exertion. Respiratory: Is not experiencing shortness of breath at rest. Gastrointestinal: Negative for nausea and vomiting. Neurological: Negative for headaches. Psychiatric: The patient is not nervous/anxious  Blood pressure (!) 142/83, pulse (!) 57, SpO2 98%. There is no height or weight on file to calculate BMI.  PHYSICAL EXAM:  Exam: General: Well-developed, well-nourished Respiratory Respiratory effort: Equal inspiration and expiration without stridor Cardiovascular Peripheral Vascular: Warm extremities with equal color/perfusion Eyes: No nystagmus with equal extraocular motion bilaterally Neuro/Psych/Balance: Patient oriented to person, place, and time; Appropriate mood and affect; Gait is intact with no imbalance; Cranial nerves I-XII are intact Head and Face Inspection: Normocephalic and atraumatic without mass or lesion Facial Strength: Facial motility symmetric and full bilaterally ENT Pinna: External ear intact and fully developed External  canal: Canal is patent with intact skin Tympanic Membrane: Clear and mobile External Nose: No scar or anatomic deformity Lips, Teeth, and gums: Mucosa and teeth intact and viable TMJ: No pain to palpation with full mobility Oral cavity/oropharynx: No erythema or exudate, no lesions present Neck Neck and Trachea: Midline trachea without mass or lesion Thyroid: No mass or nodularity Lymphatics: No lymphadenopathy   CT sinuses 07/10/23    Assessment/Plan: Encounter Diagnoses  Name Primary?   Chronic nasal congestion Yes   Nasal obstruction    Nasal septal deviation    Hypertrophy of both inferior nasal turbinates    OSA on CPAP      Assessment and Plan    Chronic Nasal Congestion/nasal obstruction with Nasal Polyps on the right and Septal Deviation/ITH Chronic nasal congestion with a deviated septum and nasal polyps, particularly on the right side, likely secondary to chronic allergies. Symptoms include difficulty breathing through the nose, mouth breathing, and postnasal drainage. Recurrent sinus infections have decreased with Flonase  and levocetirizine. Discussed need for sinus CT to evaluate chronic sinus disease and extent of nasal polyps and septal deviation. Explained that nasal polyps are typically a reaction to chronic allergies and may require surgical intervention. Discussed referral to an allergist for allergy  testing and potential allergy  shots for long-term treatment. Mentioned medications like Dupixent or Xolair to stall further growth of nasal polyps. Emphasized continuing current medications and considering nasal rinses with steroids based on scan results. - Order sinus CT scan - Refer to allergist  for allergy  testing and potential allergy  shots if candidate - Continue Flonase  2 puffs b/l nares twice daily - Continue Xyzal  5 mg daily - Order CBC and total IgE - Schedule follow-up in a couple of months to review results and discuss further management.     Update  07/12/2023 Assessment and Plan Assessment & Plan Persistent chronic nasal congestion and nasal obstruction  Septal deviation/ITH on CT sinuses no CRS. Previous nasal endoscopy with significant nasal passage narrowing and few nasal polyps. Failed maximum medical management at this point. Surgery considered to improve nasal breathing. Explained outpatient procedure, recovery, and risks. Surgery may enhance CPAP tolerance but not eliminate CPAP need for management of OSA  Summary of nasal endoscopy done during initial office visit  Internal Nose: Septum is deviated and S-shaped significant narrowing of the Right > Left nasal passages and right nasal polyp, no frank purulence. Mucosal edema and erythema present, severe Bilateral inferior turbinate hypertrophy.   - Continue Flonase  nasal spray and antihistamine - surgery order placed, patient will consider his options and call us  to schedule surgery if he decides to proceed - Nasal saline rinses   Allergic rhinitis due to dust mites, Environmental Allergies  Allergy  testing shows dust mite sensitivity. Managed with Flonase  right now - Continue Flonase  nasal spray. - Initiate nasal saline rinses once daily, especially during pollen season.  OSA - continue CPAP   Patient will schedule septo/ITR and nasal polypectomy if he elects to proceed with surgery   Artice Last, MD Otolaryngology [facility] Phone: [phone] Fax: [phone]    07/12/2023, 9:32 AM

## 2023-12-31 ENCOUNTER — Ambulatory Visit: Admitting: Internal Medicine

## 2024-03-11 ENCOUNTER — Institutional Professional Consult (permissible substitution) (INDEPENDENT_AMBULATORY_CARE_PROVIDER_SITE_OTHER)

## 2024-03-14 ENCOUNTER — Ambulatory Visit (INDEPENDENT_AMBULATORY_CARE_PROVIDER_SITE_OTHER): Payer: Self-pay

## 2024-03-14 ENCOUNTER — Encounter (INDEPENDENT_AMBULATORY_CARE_PROVIDER_SITE_OTHER): Payer: Self-pay

## 2024-03-14 VITALS — BP 144/78 | HR 62 | Temp 98.0°F | Wt 276.0 lb

## 2024-03-14 DIAGNOSIS — J339 Nasal polyp, unspecified: Secondary | ICD-10-CM

## 2024-03-14 DIAGNOSIS — J343 Hypertrophy of nasal turbinates: Secondary | ICD-10-CM

## 2024-03-14 DIAGNOSIS — J3489 Other specified disorders of nose and nasal sinuses: Secondary | ICD-10-CM

## 2024-03-14 DIAGNOSIS — Z01818 Encounter for other preprocedural examination: Secondary | ICD-10-CM

## 2024-03-14 DIAGNOSIS — R0981 Nasal congestion: Secondary | ICD-10-CM

## 2024-03-14 DIAGNOSIS — Z87891 Personal history of nicotine dependence: Secondary | ICD-10-CM

## 2024-03-14 DIAGNOSIS — J342 Deviated nasal septum: Secondary | ICD-10-CM

## 2024-03-14 DIAGNOSIS — J3089 Other allergic rhinitis: Secondary | ICD-10-CM

## 2024-03-15 NOTE — Progress Notes (Signed)
 ENT Progress Note:   Update 03/15/2024  Discussed the use of AI scribe software for clinical note transcription with the patient, who gave verbal consent to proceed.  History of Present Illness James Hatfield is a 58 year old male with chronic nasal congestion and deviated nasal septum who presents for preprocedural evaluation and review of the surgical plan for nasal surgery. His workup was completed by Dr. 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 Despite using nasal sprays, he struggles to breathe adequately and primarily resorts to mouth breathing. No history of lung disease, including smoking, COPD, or asthma. No recent weight changes. He has been informed of having extremely small nasal passages, contributing to his breathing difficulties. He uses Flonase  and levocetirizine daily to manage his symptoms. He has not undergone any nasal or sinus surgeries and still has his tonsils.  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  He has a history of sinus infections, requiring antibiotics approximately once a year in the past, though the frequency has decreased since starting Flonase .  Approximately six  months ago, he was evaluated for low energy and poor sleep quality, leading to a home sleep study that revealed mild to moderate sleep apnea. A mouth guard was recommended, but he did not pursue this treatment as he was more concerned about his nasal congestion. CPAP was not offered to him.  He has experienced chronic nasal obstruction throughout his life, characterized by nasal congestion and difficulty breathing through his nose. A recent CT scan of the sinuses showed a deviation of the nasal septum and inferior turbinate hypertrophy but no chronic sinusitis.  He has been evaluated by allergist and was found to have a possible allergy  to dust mites, but other allergy  tests were negative. He currently uses Flonase  nasal spray to manage his symptoms, which provides some relief. He has not tried nasal saline rinses.  He works in theatre stage manager care, which involves significant physical activity. He is concerned about the impact of his nasal symptoms on his work, especially considering the physical nature of his job. No history of stroke or heart attack and is not on blood thinners. His current medication includes a cholesterol-lowering drug.    Past Medical History:  Diagnosis Date   Arthritis    knees, hips   DVT (deep venous thrombosis) (HCC)    left leg    Past Surgical History:  Procedure Laterality Date   COLONOSCOPY N/A 05/01/2019   Procedure: COLONOSCOPY;  Surgeon: Golda Claudis PENNER, MD;  [facility];  Service: Endoscopy;  Laterality: N/A;  830   CYSTECTOMY     back   KNEE ARTHROSCOPY WITH MEDIAL MENISECTOMY Right 03/06/2018   Procedure: KNEE ARTHROSCOPY WITH DEBRIDEMENT AND REPAIR PARTIAL MEDIAL MENISECTOMY;  Surgeon: Edie Norleen PARAS, MD;  Location: [facility];  Service: Orthopedics;  Laterality: Right;  SMITH AND NEPHEW   POLYPECTOMY  05/01/2019   Procedure: POLYPECTOMY;  Surgeon: Golda Claudis PENNER, MD;  [facility]: [facility];  Service: Endoscopy;;   SHOULDER  ARTHROSCOPY W/ ACROMIAL REPAIR Left     History reviewed. No pertinent family history.  Social History:  reports that he quit smoking about 21 years ago. His smoking use included cigarettes. He has never used smokeless tobacco. He reports current alcohol use of about 24.0 standard drinks of alcohol per week. He reports current drug use. Drug: Marijuana.  Allergies:  Allergies  Allergen Reactions   Penicillins Anaphylaxis and Swelling    Did it involve swelling of the face/tongue/throat, SOB, or low BP? Yes Did it involve sudden or severe rash/hives, skin peeling, or any reaction on the inside of your mouth or nose? No Did you need to seek medical attention at a hospital or doctor's office? Unknown When did it last happen?      childhood allergy  If all above answers are NO, may proceed with cephalosporin use.     Medications: I have reviewed the patient's current medications.  The PMH, PSH, Medications, Allergies, and SH were reviewed and updated.  ROS: Constitutional: Negative for fever, weight loss and weight gain. Cardiovascular: Negative for chest pain and dyspnea on exertion. Respiratory: Is not experiencing shortness of breath at rest. Gastrointestinal: Negative for nausea and vomiting. Neurological: Negative for headaches. Psychiatric: The patient is not nervous/anxious  Blood pressure (!) 144/78, pulse 62, temperature 98 F (36.7 C), weight 276 lb (125.2 kg), SpO2 96%. Body mass index is 36.19 kg/m.  PHYSICAL EXAM:  Exam: General: Well-developed, well-nourished Respiratory Respiratory effort: Equal inspiration and expiration without stridor Cardiovascular Peripheral Vascular: Warm extremities with equal color/perfusion Eyes: No nystagmus with equal extraocular motion bilaterally Neuro/Psych/Balance: Patient oriented to person, place, and time; Appropriate mood and affect; Gait is intact with no imbalance; Cranial nerves I-XII are intact Head and Face Inspection:  Normocephalic and atraumatic without mass or lesion Facial Strength: Facial motility symmetric and full bilaterally ENT Nose - S shaped dns- bilateral congestion and edema. Clear drainage present bilateral nares.  Pinna: External ear intact and fully developed External canal: Canal is patent with intact skin Tympanic Membrane: Clear and mobile External Nose: No scar or anatomic deformity Lips, Teeth, and gums: Mucosa and teeth intact and viable TMJ: No pain to palpation with full mobility Oral cavity/oropharynx: No erythema or exudate, no lesions present Neck Neck and Trachea: Midline trachea without mass or lesion Thyroid: No mass or nodularity Lymphatics: No lymphadenopathy   CT sinuses 07/10/23    Assessment/Plan: Encounter Diagnoses  Name Primary?   Chronic nasal congestion Yes   Nasal obstruction    Nasal septal deviation    Hypertrophy of both inferior nasal turbinates    Nasal polyp    Assessment & Plan Persistent chronic nasal congestion and nasal obstruction  Septal deviation/ITH on CT sinuses no CRS. Previous nasal endoscopy with significant nasal passage narrowing and few nasal polyps. Failed maximum medical management at this point. Surgery considered to improve nasal breathing. Explained outpatient procedure, recovery, and risks. Surgery may enhance CPAP tolerance but not eliminate CPAP need for management of OSA  Recommend septoplasty, inferior turbinate submucosal reduction, nasal polypectomy and concha bullosa excision on the right. - Continue Flonase  2 puffs b/l nares twice daily - Continue Xyzal  5 mg daily - CBC and total IgE, grossly unremarkable. <500 eosinophils - surgery order placed, patient will consider  his options and call us  to schedule surgery if he decides to proceed - Nasal saline rinses   Allergic rhinitis due to dust mites, Environmental Allergies  Allergy  testing shows dust mite sensitivity. Managed with Flonase  right now - Continue Flonase   nasal spray. - Initiate nasal saline rinses once daily, especially during pollen season.  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 - Reviewed CT scan findings. - Scheduled septoplasty and turbinate reduction for next Tuesday.  Preprocedural evaluation for nasal surgery Preoperative counseling provided to address postoperative activity restrictions and nasal splint management. - Discussed postoperative restrictions: light activity after one week, no lifting over fifteen pounds, maintain heart rate below 130, systolic blood pressure below 839. - Informed about nasal splints placement during surgery, potential removal at one week postoperative visit.

## 2024-03-18 ENCOUNTER — Other Ambulatory Visit (INDEPENDENT_AMBULATORY_CARE_PROVIDER_SITE_OTHER): Payer: Self-pay

## 2024-03-18 ENCOUNTER — Other Ambulatory Visit: Payer: Self-pay

## 2024-03-18 DIAGNOSIS — J343 Hypertrophy of nasal turbinates: Secondary | ICD-10-CM | POA: Diagnosis not present

## 2024-03-18 DIAGNOSIS — J339 Nasal polyp, unspecified: Secondary | ICD-10-CM | POA: Diagnosis not present

## 2024-03-18 DIAGNOSIS — J342 Deviated nasal septum: Secondary | ICD-10-CM | POA: Diagnosis not present

## 2024-03-18 MED ORDER — OXYCODONE HCL 5 MG PO TABS: 5.0000 mg | ORAL_TABLET | Freq: Four times a day (QID) | ORAL | 0 refills | Status: AC | PRN

## 2024-03-18 MED ORDER — CLINDAMYCIN HCL 300 MG PO CAPS: 300.0000 mg | ORAL_CAPSULE | Freq: Three times a day (TID) | ORAL | 0 refills | Status: AC

## 2024-03-18 MED ORDER — SALINE SPRAY 0.65 % NA SOLN: 2.0000 | Freq: Four times a day (QID) | NASAL | 0 refills | Status: AC

## 2024-03-19 LAB — SURGICAL PATHOLOGY

## 2024-03-24 ENCOUNTER — Encounter (INDEPENDENT_AMBULATORY_CARE_PROVIDER_SITE_OTHER): Payer: Self-pay

## 2024-03-24 ENCOUNTER — Ambulatory Visit (INDEPENDENT_AMBULATORY_CARE_PROVIDER_SITE_OTHER)

## 2024-03-24 VITALS — BP 133/75 | HR 56 | Resp 97 | Wt 276.0 lb

## 2024-03-24 DIAGNOSIS — Z09 Encounter for follow-up examination after completed treatment for conditions other than malignant neoplasm: Secondary | ICD-10-CM

## 2024-03-24 DIAGNOSIS — J342 Deviated nasal septum: Secondary | ICD-10-CM

## 2024-03-24 NOTE — Progress Notes (Signed)
 Discussed the use of AI scribe software for clinical note transcription with the patient, who gave verbal consent to proceed.  History of Present Illness James Hatfield is a 59 year old male with chronic nasal obstruction and allergic rhinitis who presents for postoperative follow-up after septoplasty and nasal stent placement.  Postoperative nasal obstruction and airflow - One week status post septoplasty and nasal stent placement for severe nasal obstruction, previously with complete occlusion of the left nasal passage. - Improved nasal airflow, particularly through the left side, since surgery. - Mild discomfort during suture and stent removal. - No persistent epistaxis, fever, or chills. - Regular saline nasal irrigations performed, with increased nasal drainage only during irrigation.  Allergic rhinitis and environmental triggers - Longstanding allergic rhinitis, primarily triggered by dust exposure. - Continues daily cetirizine and saline irrigations for symptom control. 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  Chronic nasal drip and tip irritation - Chronic anterior nasal drip managed by frequent wiping. - Chronic irritation and inflammation at the nasal tip, improved since surgery. - Previously unable to perform sinus irrigations due to obstruction, now able to consider this as part of routine care.  Physical Exam HEENT: Atraumatic, normocephalic. Nasal passages appear normal bilaterally. Nose appears normal post-surgery. Splints removed in office. Midline septum. Patent nares bilaterally. No septal perforation.  Assessment & Plan  Postoperative care following septoplasty and inferior turbinate reduction Significant improvement in nasal airflow, resolution of left-sided obstruction, mild residual edema and crusting, no infection or persistent polypoid tissue. Mild discomfort and minimal bleeding with saline irrigation. Risks of epistaxis with  overexertion discussed. - Removed nasal stents and trimmed residual suture material. - Advised gentle nasal saline spray four times daily for one week, taper to twice daily the following week, continue daily thereafter. - Permitted gentle nose blowing only. - Permitted gradual return to activity: lift up to 15 pounds this week, 25 pounds next week, then increase by 10 pounds weekly as tolerated. - Discontinued antibiotics following stent removal. - Permitted driving and return to work as tolerated, provided he is not taking analgesics. - Advised use of oxymetazoline (Afrin) for epistaxis or as needed for nasal congestion. - Scheduled follow-up in three or six months, with annual surveillance thereafter per preference.  Allergic rhinitis with history of nasal polyp Persistent allergic rhinitis due to dust mite exposure and occupational triggers, managed with antihistamines and saline irrigation. Risk for polyp recurrence warrants annual endoscopic surveillance. - Start daily saline irrigation to reduce allergen burden and promote nasal healing in one month - Continue antihistamine therapy as previously prescribed. 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 - Initiate sinus rinse (NeilMed or similar) after three weeks, especially post-exposure to dust/allergens. - Schedule annual endoscopic surveillance to monitor for polyp recurrence.  Chronic nasal inflammation and hyperkeratosis Chronic nasal inflammation and hyperkeratosis at the nasal tip, likely secondary to frequent mechanical irritation. Ongoing saline irrigation recommended to reduce inflammation and promote healing. - Continue daily saline irrigation to reduce inflammation and promote healing. - Surveillance of the affected area at future follow-up visits. - Monitor for any changes or worsening and report as needed.  F/u 3-6 months then annually x 2 years. Monitor right anterior septum inflammation and right  posterior nasal polyp

## 2024-03-24 NOTE — Patient Instructions (Signed)
" °  VISIT SUMMARY: You had a follow-up appointment today after your recent septoplasty and nasal stent placement. Your nasal airflow has improved, especially on the left side, and there are no signs of infection or significant complications. We discussed your ongoing allergic rhinitis and chronic nasal inflammation, and you received guidance on how to manage these conditions moving forward.  YOUR PLAN: -POSTOPERATIVE CARE FOLLOWING SEPTOPLASTY AND INFERIOR TURBINATE REDUCTION: You are healing well after your surgery, with improved nasal airflow and no signs of infection. Continue gentle nasal saline irrigation four times daily for one week, then reduce to twice daily the following week, and continue daily thereafter. You can blow your nose gently and gradually return to normal activities, starting with lifting up to 15 pounds this week and increasing by 10 pounds weekly. You can stop taking antibiotics now and use oxymetazoline (Afrin) if you experience nasal congestion or bleeding. You are allowed to drive and return to work as long as you are not taking pain medications.  -ALLERGIC RHINITIS WITH HISTORY OF NASAL POLYP: Your allergic rhinitis, mainly triggered by dust, continues to be managed with daily antihistamines and saline irrigation. To reduce exposure to dust, wear a mask while working. Start using a sinus rinse three weeks from now, especially after being exposed to dust or allergens. We will monitor for any recurrence of nasal polyps with annual endoscopic check-ups.  -CHRONIC NASAL INFLAMMATION AND HYPERKERATOSIS: You have chronic inflammation and thickening of the skin at the tip of your nose due to frequent irritation. Continue daily saline irrigation to help reduce inflammation and promote healing. We will keep an eye on this area during future visits, and you should report any changes or worsening symptoms.  INSTRUCTIONS: Please schedule a follow-up appointment in three or six months, based  on your preference, and plan for annual check-ups thereafter to monitor your condition.                      Contains text generated by [employer].                                 Contains text generated by [employer].   "

## 2024-07-31 ENCOUNTER — Ambulatory Visit (INDEPENDENT_AMBULATORY_CARE_PROVIDER_SITE_OTHER)
